# Patient Record
Sex: Female | Born: 1955 | Hispanic: Yes | Marital: Married | Smoking: Never smoker
Health system: Southern US, Community
[De-identification: ages and names within clinical notes are randomized; demographics above are authoritative.]

## PROBLEM LIST (undated history)

## (undated) DIAGNOSIS — E785 Hyperlipidemia, unspecified: Secondary | ICD-10-CM

## (undated) DIAGNOSIS — M8580 Other specified disorders of bone density and structure, unspecified site: Secondary | ICD-10-CM

## (undated) DIAGNOSIS — R7401 Elevation of levels of liver transaminase levels: Secondary | ICD-10-CM

## (undated) DIAGNOSIS — E559 Vitamin D deficiency, unspecified: Secondary | ICD-10-CM

## (undated) HISTORY — DX: Hyperlipidemia, unspecified: E78.5

## (undated) HISTORY — DX: Elevation of levels of liver transaminase levels: R74.01

## (undated) HISTORY — PX: TONSILLECTOMY AND ADENOIDECTOMY: SHX28

## (undated) HISTORY — DX: Other specified disorders of bone density and structure, unspecified site: M85.80

## (undated) HISTORY — DX: Vitamin D deficiency, unspecified: E55.9

## (undated) HISTORY — PX: GALLBLADDER SURGERY: SHX652

---

## 2000-03-14 ENCOUNTER — Encounter (INDEPENDENT_AMBULATORY_CARE_PROVIDER_SITE_OTHER): Payer: Self-pay | Admitting: *Deleted

## 2000-11-10 ENCOUNTER — Encounter: Admission: RE | Admit: 2000-11-10 | Discharge: 2000-11-10 | Payer: Self-pay | Admitting: Sports Medicine

## 2000-11-24 ENCOUNTER — Encounter: Admission: RE | Admit: 2000-11-24 | Discharge: 2000-11-24 | Payer: Self-pay | Admitting: Family Medicine

## 2001-04-04 ENCOUNTER — Emergency Department (HOSPITAL_COMMUNITY): Admission: EM | Admit: 2001-04-04 | Discharge: 2001-04-04 | Payer: Self-pay | Admitting: Emergency Medicine

## 2001-10-14 ENCOUNTER — Encounter: Payer: Self-pay | Admitting: Emergency Medicine

## 2001-10-14 ENCOUNTER — Emergency Department (HOSPITAL_COMMUNITY): Admission: EM | Admit: 2001-10-14 | Discharge: 2001-10-14 | Payer: Self-pay | Admitting: Emergency Medicine

## 2001-12-21 ENCOUNTER — Encounter: Admission: RE | Admit: 2001-12-21 | Discharge: 2002-01-14 | Payer: Self-pay | Admitting: Orthopedic Surgery

## 2002-04-20 ENCOUNTER — Encounter: Payer: Self-pay | Admitting: Internal Medicine

## 2002-04-20 ENCOUNTER — Ambulatory Visit (HOSPITAL_COMMUNITY): Admission: RE | Admit: 2002-04-20 | Discharge: 2002-04-20 | Payer: Self-pay | Admitting: Internal Medicine

## 2002-07-16 ENCOUNTER — Ambulatory Visit (HOSPITAL_COMMUNITY): Admission: RE | Admit: 2002-07-16 | Discharge: 2002-07-16 | Payer: Self-pay | Admitting: Family Medicine

## 2002-09-30 ENCOUNTER — Ambulatory Visit (HOSPITAL_COMMUNITY): Admission: RE | Admit: 2002-09-30 | Discharge: 2002-09-30 | Payer: Self-pay | Admitting: Family Medicine

## 2002-09-30 ENCOUNTER — Encounter: Payer: Self-pay | Admitting: Family Medicine

## 2002-11-02 ENCOUNTER — Other Ambulatory Visit: Admission: RE | Admit: 2002-11-02 | Discharge: 2002-11-02 | Payer: Self-pay | Admitting: Obstetrics & Gynecology

## 2002-11-02 ENCOUNTER — Encounter: Admission: RE | Admit: 2002-11-02 | Discharge: 2002-11-02 | Payer: Self-pay | Admitting: Obstetrics and Gynecology

## 2002-11-02 ENCOUNTER — Encounter (INDEPENDENT_AMBULATORY_CARE_PROVIDER_SITE_OTHER): Payer: Self-pay | Admitting: *Deleted

## 2002-11-09 ENCOUNTER — Encounter (INDEPENDENT_AMBULATORY_CARE_PROVIDER_SITE_OTHER): Payer: Self-pay | Admitting: *Deleted

## 2002-11-09 ENCOUNTER — Encounter: Admission: RE | Admit: 2002-11-09 | Discharge: 2002-11-09 | Payer: Self-pay | Admitting: Obstetrics and Gynecology

## 2005-01-22 ENCOUNTER — Emergency Department (HOSPITAL_COMMUNITY): Admission: EM | Admit: 2005-01-22 | Discharge: 2005-01-22 | Payer: Self-pay | Admitting: Emergency Medicine

## 2005-01-24 ENCOUNTER — Encounter: Admission: RE | Admit: 2005-01-24 | Discharge: 2005-01-24 | Payer: Self-pay | Admitting: Family Medicine

## 2006-02-07 ENCOUNTER — Encounter: Admission: RE | Admit: 2006-02-07 | Discharge: 2006-02-07 | Payer: Self-pay | Admitting: Family Medicine

## 2006-02-07 ENCOUNTER — Other Ambulatory Visit: Admission: RE | Admit: 2006-02-07 | Discharge: 2006-02-07 | Payer: Self-pay | Admitting: Family Medicine

## 2006-04-11 ENCOUNTER — Encounter (INDEPENDENT_AMBULATORY_CARE_PROVIDER_SITE_OTHER): Payer: Self-pay | Admitting: *Deleted

## 2007-06-03 ENCOUNTER — Encounter: Admission: RE | Admit: 2007-06-03 | Discharge: 2007-06-03 | Payer: Self-pay | Admitting: Family Medicine

## 2008-06-27 ENCOUNTER — Ambulatory Visit: Payer: Self-pay | Admitting: Gynecology

## 2008-08-03 ENCOUNTER — Ambulatory Visit: Payer: Self-pay | Admitting: Vascular Surgery

## 2008-10-21 ENCOUNTER — Ambulatory Visit: Payer: Self-pay | Admitting: Vascular Surgery

## 2008-11-23 ENCOUNTER — Ambulatory Visit: Payer: Self-pay | Admitting: Vascular Surgery

## 2008-11-30 ENCOUNTER — Ambulatory Visit: Payer: Self-pay | Admitting: Vascular Surgery

## 2009-01-13 ENCOUNTER — Ambulatory Visit: Payer: Self-pay | Admitting: Vascular Surgery

## 2010-03-04 ENCOUNTER — Encounter: Payer: Self-pay | Admitting: Family Medicine

## 2010-06-26 NOTE — Procedures (Signed)
LOWER EXTREMITY VENOUS REFLUX EXAM   INDICATION:  Left lower extremity varicose vein.   EXAM:  Using color-flow imaging and pulse Doppler spectral analysis, the  left common femoral, superficial femoral, popliteal, posterior tibial,  greater and lesser saphenous veins are evaluated.  There is no evidence  suggesting deep venous insufficiency in the left lower extremity.   The left saphenofemoral junction is not competent.  The left GSV is not  competent with the caliber as described below.   The left proximal short saphenous vein demonstrates competency.   GSV Diameter (used if found to be incompetent only)                                            Right    Left  Proximal Greater Saphenous Vein           cm       0.7 cm  Proximal-to-mid-thigh                     cm       cm  Mid thigh                                 cm       0.74 cm  Mid-distal thigh                          cm       cm  Distal thigh                              cm       0.62 cm  Knee                                      cm       0.25 cm   IMPRESSION:  1. Left greater saphenous vein reflux is identified as described      above.  2. The left greater saphenous vein is not tortuous.  3. The left deep venous system is competent.  4. The left lesser saphenous vein is competent.   ___________________________________________  [name], M.D.   CH/MEDQ  D:  08/03/2008  T:  08/03/2008  Job:  [id number]

## 2010-06-26 NOTE — Assessment & Plan Note (Signed)
OFFICE VISIT   MADALINA, ROSMAN  DOB:  [birth year]                                       11/23/2008  EAVWU#:[id number]   The patient presents today for laser ablation and stab phlebectomy.  This was done uneventfully under local anesthesia in our office.  She  had no complication of the procedure and will be seen again in 1 week  for ultrasound and physical exam followup on her left leg.   [name], M.D.  Electronically Signed   TFE/MEDQ  D:  11/23/2008  T:  11/24/2008  Job:  [id number]   cc:   Gaetano Hawthorne. Lily Peer, M.D.

## 2010-06-26 NOTE — Assessment & Plan Note (Signed)
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  She has done quite  well since the procedure.  She does report some persistent discomfort in  the medial thigh just above the knee.  She does not have any evidence of  erythema.  She is resuming her normal activities and is requesting the  ability to resume walking on her treadmill and running on her treadmill,  and I have stated that this is fine to proceed.   She did undergo a screening ultrasound by myself, and this shows  excellent postoperative appearance with closure of her great saphenous  vein throughout the thigh.  She does not have any evidence of any  thrombosed reticular or tributary varicosities at the level of her knee.  The stab phlebectomy sites all look quite good.   I have reassured patient and her husband, who is present.  I do not see  any evidence of any difficulty following procedure, and this should  continue to resolve.  She will notify us should she develop any  difficulty in the future.   [name], M.D.  Electronically Signed   TFE/MEDQ  D:  01/13/2009  T:  01/13/2009  Job:  [id number]   cc:   Gaetano Hawthorne. Lily Peer, M.D.

## 2010-06-26 NOTE — Assessment & Plan Note (Signed)
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  She is very pleasant  active 55 year old female who I had seen initially 3 months ago.  She  has been diligent in her thigh-high graduated compression garments and  reports this has given her no improvement.  She actually has more  discomfort with the compression.  On physical exam, she has marked  varicose veins beginning in her medial distal thigh and extending around  to her popliteal space and then lateral on her left calf.  Her  ultrasound reveals that her small saphenous vein is actually competent  and these are all arising from the great saphenous vein which has marked  reflux.  The patient has had to stop walking for exercise due to pain.  She used to walk 2-3 miles daily, she also has had to decrease her  household work and cleaning activities due to leg pain.  I discussed the  procedure with the patient and her husband present.  I have recommended  that we proceed with laser ablation of her left great saphenous vein and  stab phlebectomy of multiple tributary varicosities in her thigh,  popliteal space and calf.  She understands and we will proceed with this  one we have assured insurance coverage.   [name], M.D.  Electronically Signed   TFE/MEDQ  D:  10/21/2008  T:  10/21/2008  Job:  [id number]

## 2010-06-26 NOTE — Assessment & Plan Note (Signed)
OFFICE VISIT   [name] [name], [name] [name]  DOB:  1955-06-05                                       11/30/2008  WPYKD#:[id number]   The patient presents today for 1-week follow-up of laser ablation of her  left great saphenous vein, stab phlebectomy multiple tributary  varicosities in her medial thigh and popliteal fossa and posterior calf.  She has done well.  She has the usual amount of soreness over the  ablation site and no discomfort over calf.  She has mild bruising.  She  has been wearing compression garments on a daily basis.  She underwent  duplex follow-up today and this shows closure of her left great  saphenous vein and no evidence of DVT.  I am quite pleased with her  initial result as is the patient.  I will see her again in 6 weeks for  final follow-up.   [name], M.D.  Electronically Signed   TFE/MEDQ  D:  11/30/2008  T:  12/01/2008  Job:  [id number]   cc:   Gaetano Hawthorne. Lily Peer, M.D.

## 2010-06-26 NOTE — Procedures (Signed)
DUPLEX DEEP VENOUS EXAM - LOWER EXTREMITY   INDICATION:  Follow up laser ablation.   HISTORY:  Edema:  Yes  Trauma/Surgery:  Laser ablation of left greater saphenous vein.  Pain:  Yes  PE:  No  Previous DVT:  No  Anticoagulants:  No  Other:   DUPLEX EXAM:                CFV   SFV   PopV  PTV    GSV                R  L  R  L  R  L  R   L  R  L  Thrombosis       0     0     0      0     +  Spontaneous      +     +     +      +     0  Phasic           +     +     +      +     0  Augmentation     +     +     +      +     0  Compressible     +     +     +      +     0  Competent        +     +     +      +     0   Legend:  + - yes  o - no  p - partial  D - decreased   IMPRESSION:  There is no deep vein thrombus present in the left leg.  There does appear to be echogenic material in the left greater saphenous  vein from proximal level to above-knee level.    _____________________________  [name], M.D.   CB/MEDQ  D:  11/30/2008  T:  12/01/2008  Job:  [id number]

## 2010-06-26 NOTE — Consult Note (Signed)
NEW PATIENT CONSULTATION   [name], [name]  DOB:  08-18-1955                                       08/03/2008  [id number]#:[id number]   The patient presents today for evaluation of her left leg venous  varicosities and venous hypertension.  She is a very pleasant 55-year-  old female with progressive changes over the past year of varicosities  that extend down into her posterior calf.  She denies any prior history  of deep venous thrombosis or superficial thrombophlebitis or bleeding.  She reports that this has caused swelling and aching with prolonged  standing and she also has pain specifically over the varicosities  themselves.  She has no history of major medical difficulties  specifically no cardiac or pulmonary dysfunction.   FAMILY HISTORY:  Significant for venous varicosities in her mother.   SOCIAL HISTORY:  She is married with three children.  She does not work  outside the home.  She does not smoke cigarettes and does have  occasional social alcohol consumption.   REVIEW OF SYSTEMS:  Her weight is reported at 135 pounds.  She is 5 feet  4 inches tall.  Review of systems is negative for cardiac, pulmonary, GI  or GU dysfunction.  She does have pain in her legs with prolonged  standing and no orthopedic issues.   MEDICATIONS:  None.   ALLERGIES:  Codeine.   PHYSICAL EXAMINATION:  Vital signs:  Blood pressure is 117/58, heart  rate is 59, respirations are 18.  Her radial and dorsalis pedis pulses  are 2+ bilaterally.  She does not have any evidence of venous pathology  in the right leg.  On the left leg she has marked varicosities extending  from her medial distal thigh down into her posterior calf.  She  underwent noninvasive vascular laboratory studies in our office and this  reveals gross reflux beyond her left great saphenous vein extending into  the varicosities.   I had a long discussion with the patient.  I explained that this is not  a  specifically dangerous situation and does not preclude her to any more  serious problems with deep venous thrombosis.  I did explain the  importance of elevation which she is currently doing, ibuprofen for  discomfort and also compression garments for relief of her venous  hypertension.  She is fitted for graduated compression garments today in  our office of 20-30 mmHg thigh high.  I will see her again in 3 months  to determine if she is having acceptable treatment from this  conservative treatment.  I did explain the option of ablation of her  great saphenous vein and stab phlebectomy if she fails conservative  treatment.  We will see her again in 3 months.   [name], M.D.  Electronically Signed   TFE/MEDQ  D:  08/03/2008  T:  08/04/2008  Job:  [id number]   cc:   Gaetano Hawthorne. Lily Peer, M.D.

## 2010-06-29 NOTE — Letter (Signed)
September 02, 2008   Juan H. Lily Peer, M.D.  [address]  [place]  [place] [address]   Re:  [name] [name], MIAH                  DOB:  19-Aug-1955   Dear [name]:   You should have received my note from my prior office visit with [name] [name].  I subsequently received a note was a question regarding timing  of vein treatment versus any potential need for treatment for herniated  disk.  The vein treatment would be done as an outpatient and actually  cannot physically be done at the hospital due to equipment issues.  I  would see these as separate issues and feel it would be completely safe  and appropriate to proceed with any sort of disk treatment as required  and would not see her vascular issues as a contraindication to this.  She is to see me again for continued followup after we have initiated  conservative treatment for her painful varices.  Please let me know if I  could provide further information.   Sincerely,   [name], M.D.  Electronically Signed   TFE/MEDQ  D:  09/02/2008  T:  09/03/2008  Job:  [id number]

## 2011-03-29 ENCOUNTER — Ambulatory Visit: Payer: Self-pay | Admitting: Internal Medicine

## 2013-07-26 ENCOUNTER — Other Ambulatory Visit: Payer: Self-pay | Admitting: Family Medicine

## 2013-07-26 DIAGNOSIS — R109 Unspecified abdominal pain: Secondary | ICD-10-CM

## 2013-08-02 ENCOUNTER — Other Ambulatory Visit: Payer: Self-pay

## 2013-08-10 ENCOUNTER — Other Ambulatory Visit: Payer: Self-pay | Admitting: Family Medicine

## 2013-08-10 ENCOUNTER — Other Ambulatory Visit: Payer: Self-pay

## 2013-08-10 DIAGNOSIS — R1011 Right upper quadrant pain: Secondary | ICD-10-CM

## 2013-08-11 ENCOUNTER — Ambulatory Visit
Admission: RE | Admit: 2013-08-11 | Discharge: 2013-08-11 | Disposition: A | Discharge status: Home / Self Care | Payer: 59 | Source: Ambulatory Visit | Attending: Family Medicine | Admitting: Family Medicine

## 2013-08-11 ENCOUNTER — Other Ambulatory Visit: Payer: Self-pay

## 2013-08-11 DIAGNOSIS — R1011 Right upper quadrant pain: Secondary | ICD-10-CM

## 2015-11-23 ENCOUNTER — Encounter (HOSPITAL_COMMUNITY): Payer: Self-pay | Admitting: Emergency Medicine

## 2015-11-23 ENCOUNTER — Emergency Department (HOSPITAL_COMMUNITY)
Admission: EM | Admit: 2015-11-23 | Discharge: 2015-11-23 | Disposition: A | Discharge status: Home / Self Care | Payer: Managed Care, Other (non HMO) | Attending: Emergency Medicine | Admitting: Emergency Medicine

## 2015-11-23 DIAGNOSIS — Y999 Unspecified external cause status: Secondary | ICD-10-CM | POA: Insufficient documentation

## 2015-11-23 DIAGNOSIS — S40861A Insect bite (nonvenomous) of right upper arm, initial encounter: Secondary | ICD-10-CM | POA: Insufficient documentation

## 2015-11-23 DIAGNOSIS — Y929 Unspecified place or not applicable: Secondary | ICD-10-CM | POA: Insufficient documentation

## 2015-11-23 DIAGNOSIS — W57XXXA Bitten or stung by nonvenomous insect and other nonvenomous arthropods, initial encounter: Secondary | ICD-10-CM | POA: Diagnosis not present

## 2015-11-23 DIAGNOSIS — T7840XA Allergy, unspecified, initial encounter: Secondary | ICD-10-CM

## 2015-11-23 DIAGNOSIS — Y939 Activity, unspecified: Secondary | ICD-10-CM | POA: Diagnosis not present

## 2015-11-23 MED ORDER — DIPHENHYDRAMINE HCL 25 MG PO CAPS
50.0000 mg | ORAL_CAPSULE | Freq: Once | ORAL | Status: AC
  Administered 2015-11-23: 50 mg via ORAL
  Filled 2015-11-23: qty 2

## 2015-11-23 MED ORDER — PREDNISONE 20 MG PO TABS
60.0000 mg | ORAL_TABLET | Freq: Once | ORAL | Status: AC
  Administered 2015-11-23: 60 mg via ORAL
  Filled 2015-11-23: qty 3

## 2015-11-23 MED ORDER — HYDROCORTISONE 1 % EX CREA: TOPICAL_CREAM | CUTANEOUS | 0 refills | Status: DC

## 2015-11-23 MED ORDER — PREDNISONE 20 MG PO TABS: 40.0000 mg | ORAL_TABLET | Freq: Every day | ORAL | 0 refills | Status: DC

## 2015-11-23 NOTE — ED Provider Notes (Signed)
[facility]-EMERGENCY DEPT Provider Note   CSN: [id number] Arrival date & time: 11/23/15  0127     History   Chief Complaint Chief Complaint  Patient presents with  . Insect Bite    Yellow Jacket sting    HPI [name] [name] is a 60 y.o. female.  Patient presents to the emergency department with chief complaint of yellow jacket sting. She states that she has been stung a couple of times over the past 2 days. She reports right upper arm itching with some local skin swelling. She denies any pain. She denies any fevers chills. She denies having taken anything for symptoms. There are no aggravating or alleviating factors. She denies any shortness breath, wheezing, throat swelling, or difficulties, nausea, vomiting, or diarrhea.   The history is provided by the patient. No language interpreter was used.    History reviewed. No pertinent past medical history.  There are no active problems to display for this patient.   History reviewed. No pertinent surgical history.  OB History    No data available       Home Medications    Prior to Admission medications   Medication Sig Start Date End Date Taking? Authorizing Provider  predniSONE (DELTASONE) 20 MG tablet Take 2 tablets (40 mg total) by mouth daily. Take 40 mg by mouth daily for 3 days, then 20mg  by mouth daily for 3 days, then 10mg  daily for 3 days 11/23/15   , PA-C    Family History History reviewed. No pertinent family history.  Social History Social History  Substance Use Topics  . Smoking status: Never Smoker  . Smokeless tobacco: Never Used  . Alcohol use No     Allergies   Codeine   Review of Systems Review of Systems  All other systems reviewed and are negative.    Physical Exam Updated Vital Signs BP 139/62 (BP Location: Left Arm)   Pulse 61   Temp 97.7 F (36.5 C) (Oral)   Resp 22   Ht 5\' 2"  (1.575 m)   Wt 69.1 kg   SpO2 98%   BMI 27.85 kg/m   Physical Exam Physical Exam    Constitutional: Pt appears well-developed and well-nourished. No distress.  Awake, alert, nontoxic appearance  HENT:  Head: Normocephalic and atraumatic.  Mouth/Throat: Oropharynx is clear and moist. No oropharyngeal exudate.  Eyes: Conjunctivae are normal. No scleral icterus.  Neck: Normal range of motion. Neck supple.  Cardiovascular: Normal rate, regular rhythm and intact distal pulses.   Pulmonary/Chest: Effort normal and breath sounds normal. No respiratory distress. Pt has no wheezes.  Equal chest expansion  Abdominal: Soft. Bowel sounds are normal. Pt exhibits no mass. There is no tenderness. There is no rebound and no guarding.  Musculoskeletal: Normal range of motion. Pt exhibits no edema.  Neurological: Pt is alert.  Speech is clear and goal oriented Moves extremities without ataxia  Skin: Skin is warm and dry. Pt is not diaphoretic.  Right upper arm is mildly swollen around insect bite, no evidence of cellulitis or abscess  Psychiatric: Pt has a normal mood and affect.  Nursing note and vitals reviewed.    ED Treatments / Results  Labs (all labs ordered are listed, but only abnormal results are displayed) Labs Reviewed - No data to display  EKG  EKG Interpretation None       Radiology No results found.  Procedures Procedures (including critical care time)  Medications Ordered in ED Medications  predniSONE (DELTASONE) tablet 60 mg (  60 mg Oral Given 11/23/15 0154)  diphenhydrAMINE (BENADRYL) capsule 50 mg (50 mg Oral Given 11/23/15 0153)     Initial Impression / Assessment and Plan / ED Course  I have reviewed the triage vital signs and the nursing notes.  Pertinent labs & imaging results that were available during my care of the patient were reviewed by me and considered in my medical decision making (see chart for details).  Clinical Course    Patient with bee sting versus wasp sting. Patient does have local skin changes and itching.  I see no  evidence of anaphylactic reaction. There is no evidence of cellulitis or abscess.  Final Clinical Impressions(s) / ED Diagnoses   Final diagnoses:  Allergic reaction, initial encounter  Insect bite, initial encounter    New Prescriptions New Prescriptions   HYDROCORTISONE CREAM 1 %    Apply to affected area 2 times daily   PREDNISONE (DELTASONE) 20 MG TABLET    Take 2 tablets (40 mg total) by mouth daily. Take 40 mg by mouth daily for 3 days, then 20mg  by mouth daily for 3 days, then 10mg  daily for 3 days     , PA-C 11/23/15 0339    Roxy Horseman Ward, DO 11/23/15 [phone]

## 2015-11-23 NOTE — ED Triage Notes (Signed)
Patient was stung by a yellow jack on Sunday on right upper arm.  She did not have any type of reaction until tonight, now with red area on right upper arm.  Area is hard, warm and swollen.  No shortness of breath, no tongue swelling.

## 2016-01-13 ENCOUNTER — Ambulatory Visit (INDEPENDENT_AMBULATORY_CARE_PROVIDER_SITE_OTHER): Payer: 59 | Admitting: Physician Assistant

## 2016-01-13 VITALS — BP 118/70 | HR 65 | Temp 98.0°F | Resp 16 | Ht 62.0 in | Wt 154.0 lb

## 2016-01-13 DIAGNOSIS — R300 Dysuria: Secondary | ICD-10-CM

## 2016-01-13 LAB — POCT URINALYSIS DIP (MANUAL ENTRY)
BILIRUBIN UA: NEGATIVE
BILIRUBIN UA: NEGATIVE
GLUCOSE UA: NEGATIVE
Nitrite, UA: NEGATIVE
Protein Ur, POC: NEGATIVE
SPEC GRAV UA: 1.025
Urobilinogen, UA: 0.2
pH, UA: 6

## 2016-01-13 LAB — POC MICROSCOPIC URINALYSIS (UMFC)

## 2016-01-13 MED ORDER — SULFAMETHOXAZOLE-TRIMETHOPRIM 800-160 MG PO TABS: 1.0000 | ORAL_TABLET | Freq: Two times a day (BID) | ORAL | 0 refills | Status: DC

## 2016-01-13 NOTE — Progress Notes (Signed)
[facility] [address], [place] [address] [phone]  Date:  01/13/2016   Name:  [name] [name]   DOB:  [birth year]   MRN:  [id number]  PCP:  No primary care provider on file.    History of Present Illness:  [name] [name] is a 60 y.o. female patient who presents to [facility] for cc of dysuria. Dysuria began today and frequency throughout the day.  No fever, abdominal pain, or nausea.   No abnormal vaginal discharge or odor, or pruritus.  No back pain.   There are no active problems to display for this patient.   No past medical history on file.  No past surgical history on file.  Social History  Substance Use Topics  . Smoking status: Never Smoker  . Smokeless tobacco: Never Used  . Alcohol use No    No family history on file.  Allergies  Allergen Reactions  . Codeine     Medication list has been reviewed and updated.  No current outpatient prescriptions on file prior to visit.   No current facility-administered medications on file prior to visit.     ROS ROS otherwise unremarkable unless listed above.   Physical Examination: BP 118/70   Pulse 65   Temp 98 F (36.7 C) (Oral)   Resp 16   Ht 5\' 2"  (1.575 m)   Wt 154 lb (69.9 kg)   SpO2 98%   BMI 28.17 kg/m  Ideal Body Weight: Weight in (lb) to have BMI = 25: 136.4  Physical Exam  Constitutional: She is oriented to person, place, and time. She appears well-developed and well-nourished. No distress.  HENT:  Head: Normocephalic and atraumatic.  Right Ear: External ear normal.  Left Ear: External ear normal.  Eyes: Conjunctivae and EOM are normal. Pupils are equal, round, and reactive to light.  Cardiovascular: Normal rate.   Pulmonary/Chest: Effort normal. No respiratory distress.  Abdominal: Soft. Normal appearance and bowel sounds are normal. There is no tenderness.  Neurological: She is alert and oriented to person, place, and time.  Skin: She is not diaphoretic.  Psychiatric:  She has a normal mood and affect. Her behavior is normal.     Assessment and Plan: [name] [name] is a 60 y.o. female who is here today for cc of dysuria. Starting bactrim. Urine culture obtained.  Will follow up with results.  Dysuria - Plan: POCT urinalysis dipstick, POCT Microscopic Urinalysis ([facility]), sulfamethoxazole-trimethoprim (BACTRIM DS,SEPTRA DS) 800-160 MG tablet, Urine culture  77, PA-C [facility] 01/13/2016 3:04 PM

## 2016-01-13 NOTE — Patient Instructions (Addendum)
Please make sure that you are hydrating well with water.   You can use tylenol or ibuprofen for pain. Urinary Tract Infection, Adult Introduction A urinary tract infection (UTI) is an infection of any part of the urinary tract. The urinary tract includes the:  Kidneys.  Ureters.  Bladder.  Urethra. These organs make, store, and get rid of pee (urine) in the body. Follow these instructions at home:  Take over-the-counter and prescription medicines only as told by your doctor.  If you were prescribed an antibiotic medicine, take it as told by your doctor. Do not stop taking the antibiotic even if you start to feel better.  Avoid the following drinks:  Alcohol.  Caffeine.  Tea.  Carbonated drinks.  Drink enough fluid to keep your pee clear or pale yellow.  Keep all follow-up visits as told by your doctor. This is important.  Make sure to:  Empty your bladder often and completely. Do not to hold pee for long periods of time.  Empty your bladder before and after sex.  Wipe from front to back after a bowel movement if you are female. Use each tissue one time when you wipe. Contact a doctor if:  You have back pain.  You have a fever.  You feel sick to your stomach (nauseous).  You throw up (vomit).  Your symptoms do not get better after 3 days.  Your symptoms go away and then come back. Get help right away if:  You have very bad back pain.  You have very bad lower belly (abdominal) pain.  You are throwing up and cannot keep down any medicines or water. This information is not intended to replace advice given to you by your health care provider. Make sure you discuss any questions you have with your health care provider. Document Released: 07/17/2007 Document Revised: 07/06/2015 Document Reviewed: 12/19/2014  2017 Elsevier     IF you received an x-ray today, you will receive an invoice from [facility]. Please contact [facility] at  [phone] with questions or concerns regarding your invoice.   IF you received labwork today, you will receive an invoice from [employer]. Please contact [facility] at [phone] with questions or concerns regarding your invoice.   Our billing staff will not be able to assist you with questions regarding bills from these companies.  You will be contacted with the lab results as soon as they are available. The fastest way to get your results is to activate your My Chart account. Instructions are located on the last page of this paperwork. If you have not heard from [place] regarding the results in 2 weeks, please contact this office.

## 2016-01-13 NOTE — Progress Notes (Signed)
t

## 2016-01-16 LAB — URINE CULTURE

## 2016-02-10 ENCOUNTER — Encounter: Payer: Self-pay | Admitting: Physician Assistant

## 2018-02-11 HISTORY — PX: COLONOSCOPY: SHX174

## 2018-06-10 ENCOUNTER — Ambulatory Visit: Payer: 59 | Admitting: Obstetrics & Gynecology

## 2018-07-01 ENCOUNTER — Ambulatory Visit: Payer: 59 | Admitting: Obstetrics & Gynecology

## 2018-07-14 ENCOUNTER — Ambulatory Visit: Payer: 59 | Admitting: Obstetrics & Gynecology

## 2018-08-03 ENCOUNTER — Ambulatory Visit: Payer: 59 | Admitting: Obstetrics & Gynecology

## 2018-09-22 ENCOUNTER — Ambulatory Visit: Payer: 59 | Admitting: Obstetrics & Gynecology

## 2018-10-30 ENCOUNTER — Other Ambulatory Visit: Payer: Self-pay

## 2018-11-02 ENCOUNTER — Other Ambulatory Visit: Payer: Self-pay

## 2018-11-02 ENCOUNTER — Ambulatory Visit (INDEPENDENT_AMBULATORY_CARE_PROVIDER_SITE_OTHER): Payer: 59 | Admitting: Obstetrics & Gynecology

## 2018-11-02 ENCOUNTER — Encounter: Payer: Self-pay | Admitting: Obstetrics & Gynecology

## 2018-11-02 VITALS — BP 150/82 | Ht 59.0 in | Wt 152.0 lb

## 2018-11-02 DIAGNOSIS — Z6830 Body mass index (BMI) 30.0-30.9, adult: Secondary | ICD-10-CM

## 2018-11-02 DIAGNOSIS — Z01419 Encounter for gynecological examination (general) (routine) without abnormal findings: Secondary | ICD-10-CM | POA: Diagnosis not present

## 2018-11-02 DIAGNOSIS — Z13820 Encounter for screening for osteoporosis: Secondary | ICD-10-CM | POA: Diagnosis not present

## 2018-11-02 DIAGNOSIS — Z1151 Encounter for screening for human papillomavirus (HPV): Secondary | ICD-10-CM

## 2018-11-02 DIAGNOSIS — M069 Rheumatoid arthritis, unspecified: Secondary | ICD-10-CM

## 2018-11-02 DIAGNOSIS — E6609 Other obesity due to excess calories: Secondary | ICD-10-CM

## 2018-11-02 DIAGNOSIS — Z780 Asymptomatic menopausal state: Secondary | ICD-10-CM

## 2018-11-02 NOTE — Patient Instructions (Signed)
1. Encounter for routine gynecological examination with Papanicolaou smear of cervix Normal gynecologic exam in menopause.  Pap test with high-risk HPV done.  Breast exam normal.  Will schedule a screening mammogram now.  Colonoscopy 2020.  Follow-up fasting health labs here. - CBC; Future - Comp Met (CMET); Future - TSH; Future - Lipid panel; Future - VITAMIN D 25 Hydroxy (Vit-D Deficiency, Fractures); Future  2. Postmenopause Well on no hormone replacement therapy.  No postmenopausal bleeding.  3. Screening for osteoporosis Follow-up here for a bone density now.  Vitamin D supplements, calcium intake of 1200 mg daily and regular weightbearing physical activities. - DG Bone Density; Future  4. Rheumatoid arthritis involving both hands, unspecified rheumatoid factor presence (HCC) Pain and displacement of finger joints with nodules typical of rheumatoid arthritis.  Per patient, mother had rheumatoid arthritis.  Referred to Rheumatologist.  5. Class 1 obesity due to excess calories without serious comorbidity with body mass index (BMI) of 30.0 to 30.9 in adult Recommend a lower calorie/carb diet such as [employer].  Aerobic physical activities 5 times a week and weightlifting every 2 days.  [name] [name], it was a pleasure meeting you today!  I will inform you of your results as soon as they are available.

## 2018-11-02 NOTE — Progress Notes (Signed)
[name] [name] 01/03/1956 [id number]   History:    62 y.o. [id number] Married  RP:  New patient presenting for annual gyn exam   HPI: Postmenopausal, well on no hormone replacement therapy.  No postmenopausal bleeding.  No pelvic pain.  Low libido, but no pain with intercourse.  Recommended coconut oil as needed for intercourse.  Urine and bowel movements normal.  Breasts normal.  Body mass index 30.70.  Low physical activity level.  We will follow-up here for fasting health labs.  Colonoscopy 2020.  Past medical history,surgical history, family history and social history were all reviewed and documented in the EPIC chart.  Gynecologic History No LMP recorded. Patient is postmenopausal. Contraception: post menopausal status Last Pap: 2019. Results were: normal per patient Last mammogram: >4 yrs ago. Results were: normal per patient Bone Density: No recent BD Colonoscopy: 2020  Obstetric History OB History  Gravida Para Term Preterm AB Living  _0 SAB TAB Ectopic Multiple Live Births  3            # Outcome Date GA Lbr Len/2nd Weight Sex Delivery Anes PTL Lv  6 SAB           5 SAB           4 SAB           3 Para           2 Para           1 Para              ROS: A ROS was performed and pertinent positives and negatives are included in the history.  GENERAL: No fevers or chills. HEENT: No change in vision, no earache, sore throat or sinus congestion. NECK: No pain or stiffness. CARDIOVASCULAR: No chest pain or pressure. No palpitations. PULMONARY: No shortness of breath, cough or wheeze. GASTROINTESTINAL: No abdominal pain, nausea, vomiting or diarrhea, melena or bright red blood per rectum. GENITOURINARY: No urinary frequency, urgency, hesitancy or dysuria. MUSCULOSKELETAL: No joint or muscle pain, no back pain, no recent trauma. DERMATOLOGIC: No rash, no itching, no lesions. ENDOCRINE: No polyuria, polydipsia, no heat or cold intolerance. No recent change in weight.  HEMATOLOGICAL: No anemia or easy bruising or bleeding. NEUROLOGIC: No headache, seizures, numbness, tingling or weakness. PSYCHIATRIC: No depression, no loss of interest in normal activity or change in sleep pattern.     Exam:   BP (!) 150/82   Ht 4' 11" (1.499 m)   Wt 152 lb (68.9 kg)   BMI 30.70 kg/m   Body mass index is 30.7 kg/m.  General appearance : Well developed well nourished female. No acute distress HEENT: Eyes: no retinal hemorrhage or exudates,  Neck supple, trachea midline, no carotid bruits, no thyroidmegaly Lungs: Clear to auscultation, no rhonchi or wheezes, or rib retractions  Heart: Regular rate and rhythm, no murmurs or gallops Breast:Examined in sitting and supine position were symmetrical in appearance, no palpable masses or tenderness,  no skin retraction, no nipple inversion, no nipple discharge, no skin discoloration, no axillary or supraclavicular lymphadenopathy Abdomen: no palpable masses or tenderness, no rebound or guarding Extremities: no edema or skin discoloration or tenderness  Pelvic: Vulva: Normal             Vagina: No gross lesions or discharge  Cervix: No gross lesions or discharge.  Pap/HPV HR done.  Uterus  AV, normal size, shape and consistency, non-tender  and mobile  Adnexa  Without masses or tenderness  Anus: Normal   Assessment/Plan:  63 y.o. female for annual exam   1. Encounter for routine gynecological examination with Papanicolaou smear of cervix Normal gynecologic exam in menopause.  Pap test with high-risk HPV done.  Breast exam normal.  Will schedule a screening mammogram now.  Colonoscopy 2020.  Follow-up fasting health labs here. - CBC; Future - Comp Met (CMET); Future - TSH; Future - Lipid panel; Future - VITAMIN D 25 Hydroxy (Vit-D Deficiency, Fractures); Future  2. Postmenopause Well on no hormone replacement therapy.  No postmenopausal bleeding.  3. Screening for osteoporosis Follow-up here for a bone density now.   Vitamin D supplements, calcium intake of 1200 mg daily and regular weightbearing physical activities. - DG Bone Density; Future  4. Rheumatoid arthritis involving both hands, unspecified rheumatoid factor presence (HCC) Pain and displacement of finger joints with nodules typical of rheumatoid arthritis.  Per patient, mother had rheumatoid arthritis.  Referred to Rheumatologist.  5. Class 1 obesity due to excess calories without serious comorbidity with body mass index (BMI) of 30.0 to 30.9 in adult Recommend a lower calorie/carb diet such as [employer].  Aerobic physical activities 5 times a week and weightlifting every 2 days.  Counseling on above issues and coordination of care more than 50% for 10 minutes. Princess Bruins MD, 4:23 PM 11/02/2018

## 2018-11-03 ENCOUNTER — Other Ambulatory Visit: Payer: 59

## 2018-11-03 DIAGNOSIS — Z01419 Encounter for gynecological examination (general) (routine) without abnormal findings: Secondary | ICD-10-CM

## 2018-11-03 LAB — PAP, TP IMAGING W/ HPV RNA, RFLX HPV TYPE 16,18/45: HPV DNA High Risk: NOT DETECTED

## 2018-11-03 LAB — CBC
HCT: 38.1 % (ref 35.0–45.0)
Hemoglobin: 13 g/dL (ref 11.7–15.5)
MCH: 31.5 pg (ref 27.0–33.0)
MCHC: 34.1 g/dL (ref 32.0–36.0)
MCV: 92.3 fL (ref 80.0–100.0)
MPV: 9.5 fL (ref 7.5–12.5)
Platelets: 348 10*3/uL (ref 140–400)
RBC: 4.13 10*6/uL (ref 3.80–5.10)
RDW: 12.8 % (ref 11.0–15.0)
WBC: 6.2 10*3/uL (ref 3.8–10.8)

## 2018-11-03 LAB — LIPID PANEL
Cholesterol: 254 mg/dL — ABNORMAL HIGH (ref <200)
HDL: 46 mg/dL — ABNORMAL LOW (ref >=50)
LDL Cholesterol (Calc): 176 mg/dL (calc) — ABNORMAL HIGH
Non-HDL Cholesterol (Calc): 208 mg/dL (calc) — ABNORMAL HIGH (ref <130)
Total CHOL/HDL Ratio: 5.5 (calc) — ABNORMAL HIGH (ref <5.0)
Triglycerides: 175 mg/dL — ABNORMAL HIGH (ref <150)

## 2018-11-03 LAB — COMPREHENSIVE METABOLIC PANEL
AG Ratio: 1.4 (calc) (ref 1.0–2.5)
ALT: 46 U/L — ABNORMAL HIGH (ref 6–29)
AST: 31 U/L (ref 10–35)
Albumin: 4 g/dL (ref 3.6–5.1)
Alkaline phosphatase (APISO): 53 U/L (ref 37–153)
BUN: 16 mg/dL (ref 7–25)
CO2: 27 mmol/L (ref 20–32)
Calcium: 9.2 mg/dL (ref 8.6–10.4)
Chloride: 105 mmol/L (ref 98–110)
Creat: 0.63 mg/dL (ref 0.50–0.99)
Globulin: 2.8 g/dL (calc) (ref 1.9–3.7)
Glucose, Bld: 107 mg/dL — ABNORMAL HIGH (ref 65–99)
Potassium: 3.5 mmol/L (ref 3.5–5.3)
Sodium: 140 mmol/L (ref 135–146)
Total Bilirubin: 0.8 mg/dL (ref 0.2–1.2)
Total Protein: 6.8 g/dL (ref 6.1–8.1)

## 2018-11-03 LAB — TSH: TSH: 3.33 mIU/L (ref 0.40–4.50)

## 2018-11-03 LAB — VITAMIN D 25 HYDROXY (VIT D DEFICIENCY, FRACTURES): Vit D, 25-Hydroxy: 16 ng/mL — ABNORMAL LOW (ref 30–100)

## 2018-11-04 ENCOUNTER — Other Ambulatory Visit: Payer: Self-pay | Admitting: Obstetrics & Gynecology

## 2018-11-04 DIAGNOSIS — Z1231 Encounter for screening mammogram for malignant neoplasm of breast: Secondary | ICD-10-CM

## 2018-11-06 ENCOUNTER — Telehealth: Payer: Self-pay | Admitting: *Deleted

## 2018-11-06 NOTE — Telephone Encounter (Signed)
-----   Message from Princess Bruins, MD sent at 11/02/2018  4:49 PM EDT ----- Regarding: Refer to Rheumatologist Probable Rheumatoid Arthritis with small joint displacement/nodules and pain in hands.

## 2018-11-06 NOTE — Telephone Encounter (Signed)
Referral faxed to [facility] they will call patient to schedule. I will route to Claudia to relay to patient in [place].

## 2018-11-26 ENCOUNTER — Ambulatory Visit (INDEPENDENT_AMBULATORY_CARE_PROVIDER_SITE_OTHER): Payer: 59 | Admitting: Internal Medicine

## 2018-11-26 ENCOUNTER — Encounter: Payer: Self-pay | Admitting: Internal Medicine

## 2018-11-26 ENCOUNTER — Other Ambulatory Visit: Payer: Self-pay

## 2018-11-26 VITALS — BP 110/70 | HR 65 | Temp 98.3°F | Ht 60.0 in | Wt 152.6 lb

## 2018-11-26 DIAGNOSIS — Z13820 Encounter for screening for osteoporosis: Secondary | ICD-10-CM

## 2018-11-26 DIAGNOSIS — R29898 Other symptoms and signs involving the musculoskeletal system: Secondary | ICD-10-CM

## 2018-11-26 DIAGNOSIS — E559 Vitamin D deficiency, unspecified: Secondary | ICD-10-CM

## 2018-11-26 DIAGNOSIS — Z780 Asymptomatic menopausal state: Secondary | ICD-10-CM | POA: Diagnosis not present

## 2018-11-26 DIAGNOSIS — R7401 Elevation of levels of liver transaminase levels: Secondary | ICD-10-CM | POA: Diagnosis not present

## 2018-11-26 DIAGNOSIS — E785 Hyperlipidemia, unspecified: Secondary | ICD-10-CM | POA: Diagnosis not present

## 2018-11-26 MED ORDER — VITAMIN D (ERGOCALCIFEROL) 1.25 MG (50000 UNIT) PO CAPS: 50000.0000 [IU] | ORAL_CAPSULE | ORAL | 0 refills | Status: AC

## 2018-11-26 MED ORDER — ATORVASTATIN CALCIUM 20 MG PO TABS: 20.0000 mg | ORAL_TABLET | Freq: Every day | ORAL | 1 refills | Status: DC

## 2018-11-26 NOTE — Patient Instructions (Signed)
-  Nice seeing you today!!  -START Vit D 1 tab weekly for 12 weeks then return to have your levels rechecked.  -START lipitor 20 mg daily to lower your cholesterol.  -Schedule follow up in 4 months.

## 2018-11-26 NOTE — Progress Notes (Signed)
New Patient Office Visit     CC/Reason for Visit: Establish care, discuss recent lab work Previous PCP: In [employer] Last Visit: Last year  HPI: [name] [name] is a 63 y.o. female who is coming in today for the above mentioned reasons.  She has no past medical history that she is aware of.  She is originally from [place] but has been living in [place] for 18 years, she prefers to speak [place] but is fluent in [place].  She works with a sewing business.  She does not smoke, she drinks alcohol only occasionally her family history is significant for a mother who died after a CVA and a father with hyperlipidemia.  She recently saw her GYN for her annual exam.  Lab work was abnormal and she was referred to me for further evaluation.  She was found to have vitamin D deficiency, significant hyperlipidemia and ALT elevation.  She has tells me about some left leg issues that she has had for years.  She does not have pain in of itself but she does feel sometimes like her left leg "pulls" when she walks and she sometimes drags it.   Past Medical/Surgical History: Past Medical History:  Diagnosis Date  . Hyperlipidemia   . Transaminitis   . Vitamin D deficiency     Past Surgical History:  Procedure Laterality Date  . GALLBLADDER SURGERY    . TONSILLECTOMY AND ADENOIDECTOMY      Social History:  reports that she has never smoked. She has never used smokeless tobacco. She reports current alcohol use. She reports that she does not use drugs.  Allergies: Allergies  Allergen Reactions  . Codeine     Family History:  Family History  Problem Relation Age of Onset  . Hypertension Father   . Hyperlipidemia Father   . CVA Mother      Current Outpatient Medications:  .  atorvastatin (LIPITOR) 20 MG tablet, Take 1 tablet (20 mg total) by mouth daily., Disp: 90 tablet, Rfl: 1 .  Vitamin D, Ergocalciferol, (DRISDOL) 1.25 MG (50000 UT) CAPS capsule, Take 1 capsule (50,000 Units total)  by mouth every 7 (seven) days for 12 doses., Disp: 12 capsule, Rfl: 0  Review of Systems:  Constitutional: Denies fever, chills, diaphoresis, appetite change and fatigue.  HEENT: Denies photophobia, eye pain, redness, hearing loss, ear pain, congestion, sore throat, rhinorrhea, sneezing, mouth sores, trouble swallowing, neck pain, neck stiffness and tinnitus.   Respiratory: Denies SOB, DOE, cough, chest tightness,  and wheezing.   Cardiovascular: Denies chest pain, palpitations and leg swelling.  Gastrointestinal: Denies nausea, vomiting, abdominal pain, diarrhea, constipation, blood in stool and abdominal distention.  Genitourinary: Denies dysuria, urgency, frequency, hematuria, flank pain and difficulty urinating.  Endocrine: Denies: hot or cold intolerance, sweats, changes in hair or nails, polyuria, polydipsia. Musculoskeletal: Denies myalgias, back pain, joint swelling, arthralgias and gait problem.  Skin: Denies pallor, rash and wound.  Neurological: Denies dizziness, seizures, syncope, weakness, light-headedness, numbness and headaches.  Hematological: Denies adenopathy. Easy bruising, personal or family bleeding history  Psychiatric/Behavioral: Denies suicidal ideation, mood changes, confusion, nervousness, sleep disturbance and agitation    Physical Exam: Vitals:   11/26/18 1109  BP: 110/70  Pulse: 65  Temp: 98.3 F (36.8 C)  TempSrc: Temporal  SpO2: 97%  Weight: 152 lb 9.6 oz (69.2 kg)  Height: 5' (1.524 m)   Body mass index is 29.8 kg/m.  Constitutional: NAD, calm, comfortable Eyes: PERRL, lids and conjunctivae normal, wears corrective lenses  ENMT: Mucous membranes are moist. Respiratory: clear to auscultation bilaterally, no wheezing, no crackles. Normal respiratory effort. No accessory muscle use.  Cardiovascular: Regular rate and rhythm, no murmurs / rubs / gallops. No extremity edema. 2+ pedal pulses.  Abdomen: no tenderness, no masses palpated. No  hepatosplenomegaly. Bowel sounds positive.  Musculoskeletal: no clubbing / cyanosis. No joint deformity upper and lower extremities. Good ROM, no contractures. Normal muscle tone.  Skin: no rashes, lesions, ulcers. No induration Neurologic: Grossly intact and nonfocal Psychiatric: Normal judgment and insight. Alert and oriented x 3. Normal mood.    Impression and Plan:  Screening for osteoporosis  - Plan: DG BONE DENSITY (DXA)  Hyperlipidemia, unspecified hyperlipidemia type -New diagnosis, LDL is above 170. -Start Lipitor 20 mg daily, recheck fasting lipids in 6 months.  Transaminitis -Mild, suspect fatty liver disease from obesity and hyperlipidemia. -Start statin, recheck LFTs in 3 months.  Leg fatigue  - Plan: Ambulatory referral to Physical Therapy  Vitamin D deficiency -Start high-dose vitamin D supplementation for 12 weeks with follow-up levels at completion.     Patient Instructions  -Nice seeing you today!!  -START Vit D 1 tab weekly for 12 weeks then return to have your levels rechecked.  -START lipitor 20 mg daily to lower your cholesterol.  -Schedule follow up in 4 months.     Lelon Frohlich, MD [facility]

## 2018-11-30 ENCOUNTER — Ambulatory Visit (INDEPENDENT_AMBULATORY_CARE_PROVIDER_SITE_OTHER)
Admission: RE | Admit: 2018-11-30 | Discharge: 2018-11-30 | Disposition: A | Discharge status: Home / Self Care | Payer: 59 | Source: Ambulatory Visit | Attending: Internal Medicine | Admitting: Internal Medicine

## 2018-11-30 ENCOUNTER — Other Ambulatory Visit: Payer: Self-pay

## 2018-11-30 DIAGNOSIS — Z780 Asymptomatic menopausal state: Secondary | ICD-10-CM | POA: Diagnosis not present

## 2018-11-30 DIAGNOSIS — Z13820 Encounter for screening for osteoporosis: Secondary | ICD-10-CM

## 2018-12-03 ENCOUNTER — Ambulatory Visit: Payer: 59 | Admitting: Physical Therapy

## 2018-12-24 ENCOUNTER — Other Ambulatory Visit: Payer: Self-pay

## 2018-12-24 ENCOUNTER — Ambulatory Visit
Admission: RE | Admit: 2018-12-24 | Discharge: 2018-12-24 | Disposition: A | Discharge status: Home / Self Care | Payer: 59 | Source: Ambulatory Visit | Attending: Obstetrics & Gynecology | Admitting: Obstetrics & Gynecology

## 2018-12-24 DIAGNOSIS — Z1231 Encounter for screening mammogram for malignant neoplasm of breast: Secondary | ICD-10-CM

## 2018-12-28 ENCOUNTER — Other Ambulatory Visit: Payer: Self-pay

## 2018-12-28 ENCOUNTER — Ambulatory Visit: Payer: 59 | Attending: Internal Medicine | Admitting: Physical Therapy

## 2018-12-28 ENCOUNTER — Encounter: Payer: Self-pay | Admitting: Physical Therapy

## 2018-12-28 DIAGNOSIS — R2689 Other abnormalities of gait and mobility: Secondary | ICD-10-CM | POA: Insufficient documentation

## 2018-12-28 DIAGNOSIS — M6281 Muscle weakness (generalized): Secondary | ICD-10-CM

## 2018-12-28 NOTE — Therapy (Addendum)
[facility] [address] [place], [place], [address] Phone: [phone]   Fax:  [phone]  Physical Therapy Evaluation  Patient Details  Name: [name] [name] MRN: [id number] Date of Birth: 01-Jun-1955 Referring Provider (PT): Dr. Lelon Frohlich   Encounter Date: 12/28/2018  PT End of Session - 12/28/18 1654    Visit Number  1    Date for PT Re-Evaluation  02/08/19    PT Start Time  1625   came late   PT Stop Time  1655    PT Time Calculation (min)  30 min    Activity Tolerance  Patient tolerated treatment well    Behavior During Therapy  [facility] for tasks assessed/performed       Past Medical History:  Diagnosis Date  . Hyperlipidemia   . Transaminitis   . Vitamin D deficiency     Past Surgical History:  Procedure Laterality Date  . GALLBLADDER SURGERY    . TONSILLECTOMY AND ADENOIDECTOMY      There were no vitals filed for this visit.   Subjective Assessment - 12/28/18 1629    Subjective  Patient reports she drags her left leg with walking. Patient has had this issue for 15 years.    Patient Stated Goals  increased strength of left leg    Currently in Pain?  No/denies         [facility] PT Assessment - 12/28/18 0001      Assessment   Medical Diagnosis  R29.898 Leg fatique    Referring Provider (PT)  Dr. Lelon Frohlich    Onset Date/Surgical Date  --   chronic   Prior Therapy  none      Precautions   Precautions  None      Restrictions   Weight Bearing Restrictions  No      Balance Screen   Has the patient fallen in the past 6 months  No    Has the patient had a decrease in activity level because of a fear of falling?   No    Is the patient reluctant to leave their home because of a fear of falling?   No      Home Film/video editor residence      Prior Function   Level of Independence  Independent      Cognition   Overall Cognitive Status   Within Functional Limits for tasks assessed      Posture/Postural Control   Posture/Postural Control  No significant limitations      ROM / Strength   AROM / PROM / Strength  AROM;PROM;Strength      AROM   Overall AROM   Within functional limits for tasks performed    Overall AROM Comments  full lumbar ROM with no pain      Strength   Overall Strength Comments  --    Left Hip Extension  4/5    Left Knee Extension  4/5      Palpation   SI assessment   left ilium is posteriorly rotated    Palpation comment  tenderness located in left gluteal and gluteius medius and left calf, tenderness located on the lateral left foot and below the lateral malleolus      Ambulation/Gait   Ambulation/Gait  Yes    Gait Pattern  Decreased step length - left;Decreased dorsiflexion - left   limp on the left  Objective measurements completed on examination: See above findings.              PT Education - 12/28/18 1646    Education Details  Access Code: [email or web address]; gave in [place]    Person(s) Educated  Patient    Methods  Explanation;Demonstration;Verbal cues;Handout    Comprehension  Verbalized understanding;Returned demonstration       PT Short Term Goals - 12/28/18 1701      PT SHORT TERM GOAL #1   Title  independent with initial HEP    Time  3    Period  Weeks    Status  New    Target Date  01/18/19        PT Long Term Goals - 12/28/18 1701      PT LONG TERM GOAL #1   Title  independent with advanced HEP    Time  3    Period  Weeks    Status  New    Target Date  01/18/19      PT LONG TERM GOAL #2   Title  ambulate without a limp on the right due to improved strength in left LE    Time  6    Period  Weeks    Status  New    Target Date  02/08/19      PT LONG TERM GOAL #3   Title  tightness in the left leg decreased >/= 75% due to improved flexibility    Time  6    Period  Weeks    Status  New    Target Date  02/08/19              Plan - 12/28/18 1655    Clinical Impression Statement  Patient reports for 15 years she has had difficulty with walking due to left leg drags. Patient reports no pain. Patient reports she has had difficulty with her left leg since she had surgery on her veins on left leg. Patient has tenderness located on left gluteal, left gastroc and lateral left foot. Patient left hip extension, left knee extension, and left plantarflexion 4/5.Patient reports no numbness or tingling in left leg. When patient walks you can see alimp on the left and at times will drag her left foot. Patient will benefit from skilled therapy to improve flexibility and strength of left leg to improve walking.    Examination-Activity Limitations  Locomotion Level    Examination-Participation Restrictions  Community Activity    Stability/Clinical Decision Making  Stable/Uncomplicated    Clinical Decision Making  Low    Rehab Potential  Good    PT Frequency  1x / week    PT Duration  6 weeks    PT Treatment/Interventions  Gait training;Therapeutic exercise;Therapeutic activities;Patient/family education;Neuromuscular re-education;Manual techniques    PT Next Visit Plan  correct left ilium, stretches to left LE, work on gait, nustep, work on left hip extension, left ankle PF, and left knee extension    PT Home Exercise Plan  Access Code: [email or web address] in [place] and Agree with Plan of Care  Patient       Patient will benefit from skilled therapeutic intervention in order to improve the following deficits and impairments:  Difficulty walking, Decreased endurance, Decreased strength  Visit Diagnosis: Muscle weakness (generalized)  Other abnormalities of gait and mobility     Problem List Patient Active Problem List   Diagnosis Date Noted  . Hyperlipidemia   . Transaminitis   .  Vitamin D deficiency     Earlie Counts, PT 12/28/18 5:04 PM   [facility] [address], [place], [place], [address] Phone: [phone]   Fax:  [phone]  Name: [name] [name] MRN: [id number] Date of Birth: April 22, 1955 PHYSICAL THERAPY DISCHARGE SUMMARY  Visits from Start of Care: 1  Current functional level related to goals / functional outcomes: See above.     Remaining deficits: See above.    Education / Equipment: HEP given in [place].  Plan: Patient agrees to discharge.  Patient goals were not met. Patient is being discharged due to the patient's request.  Thank you for the referral. Earlie Counts, PT 12/30/18 8:39 AM  ?????

## 2018-12-28 NOTE — Patient Instructions (Signed)
Access Code: [email or web address]  URL: [email or web address]  Date: 12/28/2018  Prepared by: Earlie Counts   Exercises Supine Single Knee to Chest Stretch - 2 reps - 1 sets - 30 sec hold                            - 1x daily - 7x weekly Supine Piriformis Stretch with Foot on Ground - 2 reps - 1 sets - 30 sec hold - 1x daily - 7x weekly Supine Hamstring Stretch - 2 reps - 1 sets - 30 sec hold - 1x daily - 7x weekly Gastroc Stretch on Wall - 2 reps - 1 sets - 30 sec hold - 1x daily - 7x weekly [facility] [address], [place],  [address] Phone # [phone] Fax [phone]

## 2019-01-14 ENCOUNTER — Encounter: Payer: 59 | Admitting: Physical Therapy

## 2019-01-21 ENCOUNTER — Encounter: Payer: 59 | Admitting: Physical Therapy

## 2019-01-28 ENCOUNTER — Encounter: Payer: 59 | Admitting: Physical Therapy

## 2019-02-19 ENCOUNTER — Ambulatory Visit: Payer: No Typology Code available for payment source | Attending: Internal Medicine

## 2019-02-19 ENCOUNTER — Other Ambulatory Visit: Payer: Self-pay | Admitting: Internal Medicine

## 2019-02-19 DIAGNOSIS — E559 Vitamin D deficiency, unspecified: Secondary | ICD-10-CM

## 2019-02-19 DIAGNOSIS — Z20822 Contact with and (suspected) exposure to covid-19: Secondary | ICD-10-CM

## 2019-02-21 LAB — NOVEL CORONAVIRUS, NAA: SARS-CoV-2, NAA: NOT DETECTED

## 2019-03-30 ENCOUNTER — Ambulatory Visit: Payer: 59 | Admitting: Internal Medicine

## 2019-09-13 ENCOUNTER — Telehealth: Payer: Self-pay | Admitting: Internal Medicine

## 2019-09-13 ENCOUNTER — Encounter: Payer: Self-pay | Admitting: Family Medicine

## 2019-09-13 ENCOUNTER — Ambulatory Visit: Payer: No Typology Code available for payment source | Admitting: Family Medicine

## 2019-09-13 ENCOUNTER — Other Ambulatory Visit: Payer: Self-pay

## 2019-09-13 VITALS — BP 118/70 | HR 66 | Temp 98.2°F | Wt 152.4 lb

## 2019-09-13 DIAGNOSIS — M5432 Sciatica, left side: Secondary | ICD-10-CM

## 2019-09-13 DIAGNOSIS — S76012A Strain of muscle, fascia and tendon of left hip, initial encounter: Secondary | ICD-10-CM

## 2019-09-13 MED ORDER — DICLOFENAC SODIUM 75 MG PO TBEC: 75.0000 mg | DELAYED_RELEASE_TABLET | Freq: Two times a day (BID) | ORAL | 0 refills | Status: DC

## 2019-09-13 NOTE — Progress Notes (Signed)
° °  Subjective:    Patient ID: [name] [name], female    DOB: [birth year], 64 y.o.   MRN: [id number]  HPI Here with her husband to discuss several types of symptoms. About 9 years ago she developed what she calls a "lazy leg" on the left side. She feels a slight weakness in the leg, and sometimes as she is walking she feels like she is twisting her hips to swing the leg forward instead of moving it normally. No giving way or falling. No numbness in the leg or foot. She was diagnosed with sciatica in the right lower back and leg some years ago when she was having a lot of pain. Fortunately this resolved. She now feels some mild pain in the left buttock that radiates down the leg. She tries to avoid taking anything OTC for this. Now in the past 2 weeks she has also had a different pain in the left groin area. This is sharper and she has more of this pain when she is walking.    Review of Systems  Constitutional: Negative.   Respiratory: Negative.   Cardiovascular: Negative.   Musculoskeletal: Positive for arthralgias and gait problem.  Neurological: Positive for weakness. Negative for numbness.       Objective:   Physical Exam Constitutional:      Appearance: Normal appearance.     Comments: She walks normally down the hall   Cardiovascular:     Rate and Rhythm: Normal rate and regular rhythm.     Pulses: Normal pulses.     Heart sounds: Normal heart sounds.  Pulmonary:     Effort: Pulmonary effort is normal.     Breath sounds: Normal breath sounds.  Abdominal:     General: Abdomen is flat. Bowel sounds are normal. There is no distension.     Palpations: Abdomen is soft. There is no mass.     Tenderness: There is no abdominal tenderness. There is no guarding or rebound.     Hernia: No hernia is present.  Musculoskeletal:     Comments: She is mildly tender in the left lower back and over the left sciatic notch. The spine has full ROM. Negative SLR. The left hip has full ROM but she is  very tender over the insertion of the left hip flexor onto the pelvis.   Neurological:     Mental Status: She is alert.           Assessment & Plan:  She has 2 issues, one is chronic left sided sciatica, and also now she has an acute left hip flexor strain. She will rest and try Diclofenac BID. If this is not successful, we may consider PT.  Gershon Crane, MD

## 2019-09-13 NOTE — Telephone Encounter (Signed)
Patient is wanting to switch to Dr. Clent Ridges.  Is this ok with both providers?

## 2019-09-14 NOTE — Telephone Encounter (Signed)
OK with me also.  ?

## 2019-09-14 NOTE — Telephone Encounter (Signed)
Ok with me 

## 2019-09-17 NOTE — Telephone Encounter (Signed)
Scheduled TOC appt with pt. °

## 2019-09-21 ENCOUNTER — Other Ambulatory Visit: Payer: Self-pay

## 2019-09-21 ENCOUNTER — Ambulatory Visit: Payer: No Typology Code available for payment source | Admitting: Family Medicine

## 2019-09-21 ENCOUNTER — Encounter: Payer: Self-pay | Admitting: Family Medicine

## 2019-09-21 VITALS — BP 122/64 | HR 76 | Temp 98.7°F | Wt 153.0 lb

## 2019-09-21 DIAGNOSIS — R29898 Other symptoms and signs involving the musculoskeletal system: Secondary | ICD-10-CM | POA: Diagnosis not present

## 2019-09-21 DIAGNOSIS — E785 Hyperlipidemia, unspecified: Secondary | ICD-10-CM | POA: Diagnosis not present

## 2019-09-21 MED ORDER — ATORVASTATIN CALCIUM 20 MG PO TABS: 20.0000 mg | ORAL_TABLET | Freq: Every day | ORAL | 3 refills | Status: DC

## 2019-09-21 NOTE — Progress Notes (Signed)
   Subjective:    Patient ID: [name] [name], female    DOB: [birth year], 64 y.o.   MRN: [id number]  HPI Here to establish care after transferring from Dr. Ardyth Harps. I saw her on 09-13-19 for 2 issues, one being acute pain in the left groin and the other being chronic weakness in the left leg. We felt she had an acute groin strain and gave her Diclofenac to take. This has worked quite well and today she is pain free. However the leg weakness persists. She had one visit to [facility] last November but she was not happy with this visit, so she never went back. Otherwise she seems to be in good health. She takes Lipitor for high cholesterol. Her last complete physical was on 11-26-18. She had a well GYN exam with Dr. Seymour Bars last September. She gets yearly mammograms and she had a bone density exam last October. She had a colonoscopy with Dr. Karena Addison on [address] in [place] [phone]) sometime last year and she had a single polyp removed. She is not sure what type polyp this was or when a follow up was recommended.    Review of Systems  Constitutional: Negative.   Respiratory: Negative.   Cardiovascular: Negative.   Gastrointestinal: Negative.   Genitourinary: Negative.   Musculoskeletal: Positive for arthralgias.  Neurological: Positive for weakness. Negative for numbness.       Objective:   Physical Exam Constitutional:      Appearance: Normal appearance.  Cardiovascular:     Rate and Rhythm: Normal rate and regular rhythm.     Pulses: Normal pulses.     Heart sounds: Normal heart sounds.  Pulmonary:     Effort: Pulmonary effort is normal.     Breath sounds: Normal breath sounds.  Neurological:     General: No focal deficit present.     Mental Status: She is alert and oriented to person, place, and time.           Assessment & Plan:  Intro visit. Her left groin strain has resolved. She has chronic left leg weakness so we will refer her to Sports Medicine to  evaluate and treat this. Her dyslipidemia seems to be stable. We will plan on giving her another well exam with fasting labs in late October. We will arrange to have her GI records sent to [place] so we can get more details about her colonoscopy.  Gershon Crane, MD

## 2019-09-29 ENCOUNTER — Ambulatory Visit: Payer: No Typology Code available for payment source | Admitting: Family Medicine

## 2019-09-29 ENCOUNTER — Ambulatory Visit (INDEPENDENT_AMBULATORY_CARE_PROVIDER_SITE_OTHER): Payer: No Typology Code available for payment source

## 2019-09-29 ENCOUNTER — Other Ambulatory Visit: Payer: Self-pay

## 2019-09-29 ENCOUNTER — Encounter: Payer: Self-pay | Admitting: Family Medicine

## 2019-09-29 VITALS — BP 130/70 | HR 68 | Ht 60.0 in | Wt 151.4 lb

## 2019-09-29 DIAGNOSIS — M25552 Pain in left hip: Secondary | ICD-10-CM

## 2019-09-29 DIAGNOSIS — M5416 Radiculopathy, lumbar region: Secondary | ICD-10-CM

## 2019-09-29 NOTE — Progress Notes (Signed)
Subjective:    I'm seeing this patient as a consultation for:  Dr. Clent Ridges. Note will be routed back to referring provider/PCP.  CC: L leg weakness  I, Molly Weber, LAT, ATC, am serving as scribe for Dr. Clementeen Graham.  HPI: Pt is a 64 y/o female presenting w/ c/o progressively worsening L leg weakness over the past 10 years.  She states that she decided to try and walk to help w/ the weakness that then led to pain in her L leg that went from her low back to her L knee.  However, she is no longer having the pain and is only noticing weakness in her L leg.  Low back pain: yes L LE numbness/tingling: No Aggravating factors: walking; prolonged sitting Treatments tried: oral Voltaren;   Diagnostic testing: L-spine XR- 01/23/05  Past medical history, Surgical history, Family history, Social history, Allergies, and medications have been entered into the medical record, reviewed.   Review of Systems: No new headache, visual changes, nausea, vomiting, diarrhea, constipation, dizziness, abdominal pain, skin rash, fevers, chills, night sweats, weight loss, swollen lymph nodes, body aches, joint swelling, muscle aches, chest pain, shortness of breath, mood changes, visual or auditory hallucinations.   Objective:    Vitals:   09/29/19 1601  BP: 130/70  Pulse: 68  SpO2: 96%   General: Well Developed, well nourished, and in no acute distress.  Neuro/Psych: Alert and oriented x3, extra-ocular muscles intact, able to move all 4 extremities, sensation grossly intact. Skin: Warm and dry, no rashes noted.  Respiratory: Not using accessory muscles, speaking in full sentences, trachea midline.  Cardiovascular: Pulses palpable, no extremity edema. Abdomen: Does not appear distended. MSK: L-spine Normal-appearing Nontender. Normal motion to flexion rotation and lateral flexion.  Decreased extension. Lower extremity reflexes and sensation are equal normal throughout bilateral extremities. Lower  extremity strength decreased to left foot dorsiflexion and left hip abduction mildly.  Otherwise normal and equal bilaterally. Mildly positive slump test left side.  Left hip normal. Normal motion. Mildly tender palpation greater trochanter. Hip abduction strength mildly diminished 4/5.   Lab and Radiology Results  X-ray images lumbar spine and hip obtained today personally and independently reviewed  L-spine: DDD and facet DJD most prominent at L5-S1.  Left hip: No significant degenerative changes.  Lucency present iliac crest left  Await formal radiology review  Impression and Recommendations:    Assessment and Plan: 64 y.o. female with lumbar radiculopathy involving L4 nerve root causing a little bit of weakness to foot dorsiflexion as well as lateral hip pain thought to be due to trochanteric bursitis/hip abductor tendinopathy present.  Plan to treat with physical therapy..  X-rays obtained today.  If not significantly improving will proceed to MRI of L-spine.  Recheck back in about 4 weeks.  Visit conducted using Spanish interpreter.   Orders Placed This Encounter  Procedures  . DG Hip Unilat W OR W/O Pelvis 2-3 Views Left    Standing Status:   Future    Number of Occurrences:   1    Standing Expiration Date:   09/28/2020    Order Specific Question:   Reason for Exam (SYMPTOM  OR DIAGNOSIS REQUIRED)    Answer:   eval left lateral hip pain    Order Specific Question:   Preferred imaging location?    Answer:   [facility]    Order Specific Question:   Radiology Contrast Protocol - do NOT remove file path    Answer:   \\  [email or web address]  . DG Lumbar Spine 2-3 Views    Standing Status:   Future    Number of Occurrences:   1    Standing Expiration Date:   09/28/2020    Order Specific Question:   Reason for Exam (SYMPTOM  OR DIAGNOSIS REQUIRED)    Answer:   eval poss L5 radic left    Order Specific Question:   Preferred  imaging location?    Answer:   [facility]    Order Specific Question:   Radiology Contrast Protocol - do NOT remove file path    Answer:   \\[email or web address]  . Ambulatory referral to Physical Therapy    Referral Priority:   Routine    Referral Type:   Physical Medicine    Referral Reason:   Specialty Services Required    Requested Specialty:   Physical Therapy   No orders of the defined types were placed in this encounter.   Discussed warning signs or symptoms. Please see discharge instructions. Patient expresses understanding.   The above documentation has been reviewed and is accurate and complete Clementeen Graham, M.D.

## 2019-09-29 NOTE — Patient Instructions (Signed)
Thank you for coming in today. Get xray today.  Return in 4 weeks.  Plan for physical therapy.    Bursitis en la cadera Hip Bursitis  La bursitis en la cadera es la hinchazn de un saco lleno de lquido (bolsa sinovial) en la articulacin de la cadera. Esta hinchazn (inflamacin) puede ser dolorosa. Esta afeccin puede aparecer y Geneticist, molecular a lo largo del tiempo. Cules son las causas?  Lesin en la cadera.  Uso excesivo de los msculos que rodean la articulacin de la cadera.  Una lesin o ciruga anterior de la cadera.  Artritis o gota.  Diabetes.  Enfermedad tiroidea.  Infeccin.  En algunos casos, es posible que la causa se desconozca. Cules son los signos o los sntomas?  Dolor leve o moderado en la zona de la cadera. El dolor puede intensificarse con el movimiento.  Dolor con la palpacin e hinchazn de la cadera, especialmente del lado externo.  En casos poco frecuentes, la bolsa sinovial puede infectarse. Esto puede causar lo siguiente: ? [place]. ? Calor o enrojecimiento en la zona. Los sntomas pueden aparecer y Geneticist, molecular. Cmo se trata? El tratamiento de esta afeccin incluye hacer reposo, aplicar hielo, aplicar presin (compresin), levantar (elevar) la zona lesionada. Es posible que escuche que a este tratamiento se lo denomina RHCE (Reposo, hielo, compresin, elevacin). El tratamiento tambin puede incluir lo siguiente:  El uso de [place].  Extraccin de lquido de la bolsa sinovial para Technical sales engineer hinchazn.  La aplicacin de una inyeccin de un medicamento que ayuda a reducir la inflamacin (cortisona).  Otros medicamentos si la bolsa sinovial est infectada. Siga estas instrucciones en su casa: Control del dolor, la rigidez y la hinchazn   Si se lo indican, aplique hielo sobre la zona dolorida. ? Ponga el hielo en una bolsa plstica. ? Coloque una [employer] piel y Copy. ? Coloque el hielo durante , 2 a 3veces por  da. ? Levante (eleve) la cadera por encima del nivel del corazn tanto como sea posible sin que Stage manager. Para hacerlo, intente colocar una almohada debajo de las caderas mientras est [place]. Si esto le causa dolor, deje de hacerlo. Actividad  Retome sus actividades habituales como se lo haya indicado el mdico. Pregntele al mdico qu actividades son seguras para usted.  Haga reposo y Pharmacist, community la cadera tanto como pueda hasta que se sienta mejor. Instrucciones generales  [employer] de venta libre y los recetados solamente como se lo haya indicado el mdico.  Use vendas que le compriman la cadera (vendas de compresin) solamente como se lo haya indicado el mdico.  No apoye el peso del cuerpo sobre la cadera, hasta tanto el mdico lo autorice.  Utilice las [employer] se lo haya indicado el mdico.  [place] y estire suavemente la zona de la lesin tan frecuentemente como le resulte cmodo.  Concurra a todas las visitas de [address] se lo haya indicado el mdico. Esto es importante. Cmo se evita?  Haga ejercicios con regularidad como se lo haya indicado el mdico.  Precaliente y elongue adecuadamente antes de hacer actividad fsica.  Reljese y elongue despus de hacer actividad fsica.  Evite las [employer] le causan molestias o dolor en la cadera.  Evite estar sentado [employer]. Comunquese con un mdico si:  Tiene fiebre.  Tiene sntomas nuevos.  Presenta dificultad para caminar.  Tiene dificultad para [employer] cotidianas.  Su dolor empeora.  Su dolor no se alivia con medicamentos.  Se le enrojece la piel de la zona de la cadera.  Tiene sensacin de calor en la zona de la cadera. Solicite ayuda inmediatamente si:  No puede mover la cadera.  Siente Scientific laboratory technician. Resumen  La bursitis en la cadera es la hinchazn de un saco lleno de lquido (bolsa sinovial) en la cadera.  La bursitis en la cadera puede  ser dolorosa.  Los sntomas suelen aparecer y Geneticist, molecular con Museum/gallery conservator.  Esta afeccin se trata con reposo, hielo, compresin, elevacin y medicamentos. Esta informacin no tiene Theme park manager el consejo del mdico. Asegrese de hacerle al mdico cualquier pregunta que tenga. Document Revised: 10/23/2017 Document Reviewed: 10/23/2017 Elsevier Patient Education  2020 [employer].   Dolor radicular Radicular Pain El dolor radicular es un tipo de dolor que se extiende desde la espalda o el cuello a lo largo del nervio raqudeo. Los nervios raqudeos se originan en la mdula espinal y llegan a los msculos. El dolor radicular a veces se conoce como radiculopata, radiculitis o pinzamiento de un nervio. Si tiene este tipo de Engineer, mining, tambin puede sentir debilidad, adormecimiento u hormigueo en la zona del cuerpo a la que llega ese nervio. El dolor puede ser agudo y sentirse como un ardor. Segn el nervio raqudeo que est afectado, el dolor puede aparecer en los siguientes lugares:  Zona del cuello (dolor radicular cervical). Tambin puede Financial risk analyst, adormecimiento, debilidad u hormigueo [employer].  Zona de la mitad de la columna (dolor radicular torcico). Se siente en la espalda y en el pecho. Este tipo es poco frecuente.  Zona inferior de la espalda (dolor radicular lumbar). Se siente un dolor lumbar. Tambin puede Financial risk analyst, adormecimiento, debilidad u hormigueo en las nalgas o en las piernas. La citica es un tipo de dolor radicular lumbar que se extiende por la parte de atrs de la pierna. El dolor radicular se produce cuando uno de los nervios espinales se irrita o se aprieta (comprime). A menudo se debe a algo que ejerce presin sobre un nervio espinal, como uno de los huesos de la columna vertebral (vrtebras) o una de las almohadillas redondas que estn entre las vrtebras (discos intervertebrales). Esto puede deberse a lo siguiente:  Una lesin.  El desgaste o el  envejecimiento de un disco.  El crecimiento de un espoln seo que aprieta el [place]. El dolor radicular suele desaparecer cuando se siguen las indicaciones del mdico sobre cmo aliviarlo en la casa. Siga estas indicaciones en su casa: Control del dolor      Si se lo indican, aplique hielo sobre la zona afectada: ? Ponga el hielo en una bolsa plstica. ? Coloque una [employer] piel y Copy. ? Coloque el hielo durante , 2 a 3veces por da.  Si se lo indican, aplique calor en la zona afectada con la frecuencia que le haya indicado el mdico. Use la fuente de calor que el mdico le recomiende, como una compresa de calor hmedo o una almohadilla trmica. ? Coloque una [employer] piel y la fuente de Airline pilot. ? Aplique calor durante 20 a . ? Retire la fuente de calor si la piel se pone de color rojo brillante. Esto es especialmente importante si no puede sentir dolor, calor o fro. Puede correr un riesgo mayor de sufrir quemaduras. Actividad   No permanezca sentado o acostado durante largos perodos.  Intente mantenerse lo ms activo posible. Pregntele al mdico qu tipo de ejercicio o actividad es mejor para usted.  Evite las actividades que empeoren el dolor, como doblarse y Lexicographer objetos.  No levante ningn objeto que pese ms de 10libras (4,5kg) o el lmite de peso que le hayan indicado, hasta que el mdico le diga que puede [place].  Use una tcnica adecuada al levantar objetos. La tcnica adecuada para levantar objetos consiste en doblar las rodillas y levantarse.  Haga ejercicios de fuerza y flexibilidad solamente como se lo haya indicado el mdico o el fisioterapeuta. Indicaciones generales  [employer] de venta libre y los recetados solamente como se lo haya indicado el mdico.  Est atento a cualquier [employer] sntomas.  Concurra a todas las visitas de [address] se lo haya indicado el mdico. Esto es  importante. ? El mdico lo puede enviar a un fisioterapeuta para Electrical engineer. Comunquese con un mdico si:  El dolor y otros sntomas empeoran.  El medicamento no IT trainer.  El dolor no mejora despus de unas semanas de cuidado Facilities manager.  Tiene fiebre. Solicite ayuda inmediatamente si:  Licensed conveyancer, adormecimiento o debilidad intensos.  Tiene dificultad con el control de la vejiga o los intestinos. Resumen  El dolor radicular es un tipo de dolor que se extiende desde la espalda o el cuello a lo largo del nervio raqudeo.  Si tiene dolor radicular, tambin puede sentir debilidad, adormecimiento u hormigueo en la zona del cuerpo a la que llega ese nervio.  El dolor puede ser agudo o sentirse como ardor.  El dolor radicular puede tratarse con hielo, calor, medicamentos o fisioterapia. Esta informacin no tiene Theme park manager el consejo del mdico. Asegrese de hacerle al mdico cualquier pregunta que tenga. Document Revised: 10/01/2017 Document Reviewed: 10/01/2017 Elsevier Patient Education  2020 [employer].

## 2019-09-30 ENCOUNTER — Telehealth: Payer: Self-pay | Admitting: Family Medicine

## 2019-09-30 ENCOUNTER — Encounter: Payer: Self-pay | Admitting: Family Medicine

## 2019-09-30 NOTE — Telephone Encounter (Signed)
The [facility] called with a call report for the patient's DG HIP/PELVIS that they are concerned about.  IMPRESSION: Mild symmetric narrowing of each hip joint. No fracture or dislocation.  Lucent area in the left iliac crest measuring 2.5 x 1.5 cm. Etiology for this lesion is uncertain. The margins of this lesion are smooth and well-circumscribed suggesting benign etiology. It may be prudent to consider nuclear medicine bone scan to assess for abnormal metabolic activity in this lesion.

## 2019-10-01 ENCOUNTER — Telehealth: Payer: Self-pay | Admitting: Family Medicine

## 2019-10-01 DIAGNOSIS — M899 Disorder of bone, unspecified: Secondary | ICD-10-CM

## 2019-10-01 NOTE — Telephone Encounter (Signed)
No pre cert required for bone scan sent patient mychart message with [place] number to call and schedule

## 2019-10-01 NOTE — Telephone Encounter (Signed)
Bone scan ordered to evaluate lucency seen on the x-ray

## 2019-10-01 NOTE — Progress Notes (Signed)
MRI hip does not show significant arthritis.  However there is an incidentally found lucency in the pelvis bone on the left side.  This measures 2.5 x 1.5 cm.  It does not look concerning for cancer but radiologist would like me to proceed with bone scan to further evaluate this is of we are certain were not dealing with anything bad.  I will arrange for the bone scan to be done in the near future.

## 2019-10-01 NOTE — Telephone Encounter (Signed)
Noted.  Have already ordered bone scan.

## 2019-10-01 NOTE — Progress Notes (Signed)
X-ray L-spine shows some narrowing at L2-3 and L4-5 and some facet arthritis at L5-S1.  This can cause of back pain and potentially pinched nerve down the leg and when we are worried about.  We'll give this a try with physical therapy and if not better MRI will tell us more.

## 2019-10-05 ENCOUNTER — Other Ambulatory Visit: Payer: Self-pay | Admitting: Family Medicine

## 2019-10-08 ENCOUNTER — Other Ambulatory Visit: Payer: Self-pay

## 2019-10-08 ENCOUNTER — Encounter (HOSPITAL_COMMUNITY)
Admission: RE | Admit: 2019-10-08 | Discharge: 2019-10-08 | Disposition: A | Discharge status: Home / Self Care | Payer: No Typology Code available for payment source | Source: Ambulatory Visit | Attending: Family Medicine | Admitting: Family Medicine

## 2019-10-08 ENCOUNTER — Ambulatory Visit (HOSPITAL_COMMUNITY)
Admission: RE | Admit: 2019-10-08 | Discharge: 2019-10-08 | Disposition: A | Discharge status: Home / Self Care | Payer: No Typology Code available for payment source | Source: Ambulatory Visit | Attending: Family Medicine | Admitting: Family Medicine

## 2019-10-08 DIAGNOSIS — M899 Disorder of bone, unspecified: Secondary | ICD-10-CM | POA: Diagnosis present

## 2019-10-08 MED ORDER — TECHNETIUM TC 99M MEDRONATE IV KIT
20.0000 | PACK | Freq: Once | INTRAVENOUS | Status: AC | PRN
  Administered 2019-10-08: 20 via INTRAVENOUS

## 2019-10-11 NOTE — Progress Notes (Signed)
Bone scan looks normal. No evidence of cancer on the scan.

## 2019-10-27 ENCOUNTER — Other Ambulatory Visit: Payer: Self-pay

## 2019-10-27 ENCOUNTER — Ambulatory Visit: Payer: No Typology Code available for payment source | Admitting: Family Medicine

## 2019-10-27 VITALS — BP 110/72 | HR 64 | Ht 60.0 in | Wt 151.0 lb

## 2019-10-27 DIAGNOSIS — M25552 Pain in left hip: Secondary | ICD-10-CM | POA: Diagnosis not present

## 2019-10-27 DIAGNOSIS — M5416 Radiculopathy, lumbar region: Secondary | ICD-10-CM | POA: Diagnosis not present

## 2019-10-27 NOTE — Patient Instructions (Signed)
Thank you for coming in today. Please call for physical therapy.  Let me know if you a problem scheduling.

## 2019-10-27 NOTE — Progress Notes (Signed)
Bruce Donath, am serving as a scribe for Dr. Clementeen Graham. This visit occurred during the SARS-CoV-2 public health emergency.  Safety protocols were in place, including screening questions prior to the visit, additional usage of staff PPE, and extensive cleaning of exam room while observing appropriate contact time as indicated for disinfecting solutions.   [name] [name] is a 64 y.o. female who presents to [facility] today for f/u of L leg weakness.  She was last seen by Dr. Denyse Amass on 09/29/19 and was referred to PT but has not completed any visits yet.  Since her last visit, pt reports that her pain is the same as last visit. Pain in left groin with standing and walking.   She wanted to talk to me about the result of the bone scan before proceeding with physical therapy.  Diagnostic testing: L-spine and L hip XR- 09/29/19  Pertinent review of systems: No fevers or chills  Relevant historical information: Hyperlipidemia   Exam:  BP 110/72   Pulse 64   Ht 5' (1.524 m)   Wt 151 lb (68.5 kg)   SpO2 96%   BMI 29.49 kg/m  General: Well Developed, well nourished, and in no acute distress.   MSK: Left hip normal-appearing normal motion. No significant pain with internal rotation or flexion. Strength is intact. L-spine normal-appearing nontender normal motion. Left foot dorsiflexion strength slightly diminished.    Lab and Radiology Results DG Lumbar Spine 2-3 Views  Result Date: 09/30/2019 CLINICAL DATA:  Low back pain EXAM: LUMBAR SPINE - 2-3 VIEW COMPARISON:  Pelvis radiograph September 30, 2019 FINDINGS: Frontal, lateral, and spot lumbosacral lateral images were obtained. There are 5 non-rib-bearing lumbar type vertebral bodies. There is no fracture or spondylolisthesis. There is slight disc space narrowing at L2-3 and L4-5. No erosive change. There is facet osteoarthritic change at L5-S1 bilaterally. A lucent area in the left iliac crest is described  in the pelvis radiograph report. IMPRESSION: Mild disc space narrowing at L2-3 and L4-5. Facet osteoarthritic change at L5-S1 bilaterally. No fracture or spondylolisthesis. Well-circumscribed lucent area in the left iliac bone is described in the pelvis radiograph report. Electronically Signed   By: Bretta Bang III M.D.   On: 09/30/2019 15:05   NM Bone Scan Whole Body  Result Date: 10/09/2019 CLINICAL DATA:  Lucent lesions seen over left iliac crest on a hip x-ray from September 29, 2019. EXAM: NUCLEAR MEDICINE WHOLE BODY BONE SCAN TECHNIQUE: Whole body anterior and posterior images were obtained approximately 3 hours after intravenous injection of radiopharmaceutical. RADIOPHARMACEUTICALS:  20.0 mCi Technetium-44m MDP IV COMPARISON:  Hip x-ray September 29, 2019 FINDINGS: Degenerative changes in the right fingers in the bilateral toes. No other abnormal uptake identified in the bones. Specifically, no abnormal uptake identified in the left iliac crest. Review of the hip x-ray suggests the lucency may have represented air within the descending colon superimposed over the iliac bone. Soft tissue uptake is normal. Mild degenerative changes in the lower lumbar spine to the right. No other abnormalities. IMPRESSION: No scintigraphic evidence of malignancy. No abnormal uptake in the left iliac crest to suggest a suspicious bony lesion. Electronically Signed   By: Gerome Sam III M.D   On: 10/09/2019 17:19   DG Hip Unilat W OR W/O Pelvis 2-3 Views Left  Result Date: 09/30/2019 CLINICAL DATA:  Pain EXAM: DG HIP (WITH OR WITHOUT PELVIS) 2-3V LEFT COMPARISON:  None. FINDINGS: Frontal pelvis as well as frontal and  lateral left hip images were obtained. No fracture or dislocation. There is mild symmetric narrowing of each hip joint. No erosive change. There is a lucent area in the left iliac bone measuring 2.5 x 1.5 cm. IMPRESSION: Mild symmetric narrowing of each hip joint. No fracture or dislocation. Lucent area  in the left iliac crest measuring 2.5 x 1.5 cm. Etiology for this lesion is uncertain. The margins of this lesion are smooth and well-circumscribed suggesting benign etiology. It may be prudent to consider nuclear medicine bone scan to assess for abnormal metabolic activity in this lesion. These results will be called to the ordering clinician or representative by the Radiologist Assistant, and communication documented in the PACS or [employer]. Electronically Signed   By: Bretta Bang III M.D.   On: 09/30/2019 15:04       Assessment and Plan: 64 y.o. female with lumbar radiculopathy and anterior hip pain.  Never did physical therapy.  Reordered physical therapy patient will call and schedule soon.  If not better with me MRI lumbar spine and possibly either diagnostic and injection or MRI hip.  Recheck back in 1 to 2 months.   PDMP not reviewed this encounter. Orders Placed This Encounter  Procedures  . Ambulatory referral to Physical Therapy    Referral Priority:   Routine    Referral Type:   Physical Medicine    Referral Reason:   Specialty Services Required    Requested Specialty:   Physical Therapy   No orders of the defined types were placed in this encounter.    Discussed warning signs or symptoms. Please see discharge instructions. Patient expresses understanding.   The above documentation has been reviewed and is accurate and complete Clementeen Graham, M.D.  Total encounter time 20 minutes including face-to-face time with the patient and charting on the date of service.   Visit conducted today using Spanish interpreter.

## 2019-11-26 ENCOUNTER — Encounter: Payer: No Typology Code available for payment source | Admitting: Family Medicine

## 2019-11-29 ENCOUNTER — Ambulatory Visit (INDEPENDENT_AMBULATORY_CARE_PROVIDER_SITE_OTHER): Payer: No Typology Code available for payment source | Admitting: Physical Therapy

## 2019-11-29 ENCOUNTER — Other Ambulatory Visit: Payer: Self-pay

## 2019-11-29 ENCOUNTER — Encounter: Payer: Self-pay | Admitting: Physical Therapy

## 2019-11-29 DIAGNOSIS — M6281 Muscle weakness (generalized): Secondary | ICD-10-CM

## 2019-11-29 DIAGNOSIS — M545 Low back pain, unspecified: Secondary | ICD-10-CM | POA: Diagnosis not present

## 2019-11-29 DIAGNOSIS — M25552 Pain in left hip: Secondary | ICD-10-CM

## 2019-11-29 DIAGNOSIS — G8929 Other chronic pain: Secondary | ICD-10-CM

## 2019-11-29 NOTE — Patient Instructions (Signed)
Access Code: [email or web address] URL: [email or web address] Date: 11/29/2019 Prepared by: Sedalia Muta  Exercises Supine Single Knee to Chest Stretch - 2 x daily - 3 reps - 30 hold Supine Piriformis Stretch Pulling Heel to Hip - 2 x daily - 3 reps - 30 hold Seated Piriformis Stretch with Trunk Bend - 2 x daily - 3 reps - 30 hold Sidelying Hip Abduction - 1 x daily - 1-2 sets - 10 reps Supine March - 2 x daily - 1 sets - 10 reps Supine Bridge - 1 x daily - 2 sets - 10 reps  Printed in spanish for patient

## 2019-11-30 NOTE — Therapy (Signed)
[facility] [address] [place], [place], [address] Phone: [phone]   Fax:  [phone]  Physical Therapy Evaluation  Patient Details  Name: [name] [name] MRN: [id number] Date of Birth: [birth year] Referring Provider (PT): Clementeen Graham   Encounter Date: 11/29/2019   PT End of Session - 11/29/19 1550    Visit Number 1    Number of Visits 12    Date for PT Re-Evaluation 01/10/20    Authorization Type Aetna    PT Start Time 1432    PT Stop Time 1515    PT Time Calculation (min) 43 min    Activity Tolerance Patient tolerated treatment well    Behavior During Therapy [facility] for tasks assessed/performed           Past Medical History:  Diagnosis Date  . Hyperlipidemia   . Transaminitis   . Vitamin D deficiency     Past Surgical History:  Procedure Laterality Date  . GALLBLADDER SURGERY    . TONSILLECTOMY AND ADENOIDECTOMY      There were no vitals filed for this visit.    Subjective Assessment - 11/29/19 1438    Subjective Increased pain in L thigh/leg with walking. Was walking up to 2 hours, pain got worse. States pain for a few years, but was increased lately. Also states chronic back pain. 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 Most pain was in L anteiror hip, thigh, into groin, worse with walking. States no pain in last copule weeks, but notes L Leg "dragging" behind R side and does not feel the same.    Limitations Standing;Walking;House hold activities    Patient Stated Goals make L leg feel like the R leg.    Currently in Pain? Yes    Pain Score 2     Pain Location Hip    Pain Orientation Left    Pain Descriptors / Indicators Aching    Pain Type Acute pain    Pain Onset More than a month ago    Pain Frequency Intermittent    Aggravating Factors  standing, walking              [facility] PT Assessment - 11/30/19 0001      Assessment   Medical Diagnosis L hip pain/ back pain/ gait    Referring Provider (PT) Clementeen Graham    Prior Therapy no      Balance Screen   Has the patient fallen in the past 6 months No      Prior Function   Level of Independence Independent      Cognition   Overall Cognitive Status Within Functional Limits for tasks assessed      ROM / Strength   AROM / PROM / Strength AROM;Strength      AROM   Overall AROM Comments HIp: WNL bil; Lumbar: mild limitations for flex, ext       Strength   Overall Strength Comments L Hip: 4-/5, R: 4/5 : knee: 5/5       Palpation   Palpation comment Soreness in L groin, pubic symphysis, greater trochanter, tenderness in L glute       Special Tests   Other special tests Neg fabir, fadir, Neg SLR.       Ambulation/Gait   Gait Comments mild limp, L LE with decreased hip flexion, decreased cadence on L and quickness of step compared to R.  Objective measurements completed on examination: See above findings.       [facility] Adult PT Treatment/Exercise - 11/30/19 0001      Exercises   Exercises Knee/Hip      Knee/Hip Exercises: Stretches   Other Knee/Hip Stretches SKTC 30 sec x3;     Other Knee/Hip Stretches Mod piriformis 30 sec x 2 bil;       Knee/Hip Exercises: Supine   Bridges 10 reps    Other Supine Knee/Hip Exercises Supine march x 20;       Knee/Hip Exercises: Sidelying   Hip ABduction 10 reps;Left                  PT Education - 11/29/19 1550    Education Details PT POC, Exam findings, HEP    Person(s) Educated Patient    Methods Explanation;Demonstration;Tactile cues;Verbal cues;Handout    Comprehension Verbalized understanding;Returned demonstration;Verbal cues required;Need further instruction;Tactile cues required            PT Short Term Goals - 11/29/19 1554      PT SHORT TERM GOAL #1   Title independent with initial HEP    Time 2    Period Weeks    Status New    Target Date 12/13/19             PT Long Term Goals - 11/29/19 1554      PT LONG TERM GOAL  #1   Title independent with final  HEP    Time 6    Period Weeks    Status New    Target Date 01/10/20      PT LONG TERM GOAL #2   Title Pt to demo improved strength of L hip to be at least 4+/5 for all motions, to improve stability and pain    Time 6    Period Weeks    Status New    Target Date 01/10/20      PT LONG TERM GOAL #3   Title Pt to demo improved gait pattern, to be [facility] for pt age, with equal stance time and cadence on L and R, without limp.    Time 6    Period Weeks    Status New    Target Date 01/10/20      PT LONG TERM GOAL #4   Title Pt to demo ability for walking/exercise up to 2 mi, without pain    Time 6    Period Weeks    Status New    Target Date 01/10/20      PT LONG TERM GOAL #5   Title Pt to report pain in L hip to be 0-2/10 with activity    Time 6    Period Weeks    Status New    Target Date 01/10/20                  Plan - 11/30/19 0919    Clinical Impression Statement Pt presents with primary complaint of increased pain in L hip, as well as chronic back pain. She states chronic gait asymmetry, and has decreased  advancement of L hip with gait.  She has mild weakness with strength testing, but is more apparent with standing and walking deficits. Pt with soreness in anterior and lateral hip with palpation today, but pain has improved quite a bit since MD visit. Pt with lack of effective HEP for dx, and will benefit from eduction on this. Pt to benefit from skilled PT to improve  deficits, gait, and pain.    Personal Factors and Comorbidities Time since onset of injury/illness/exacerbation    Examination-Activity Limitations Locomotion Level;Stairs;Stand    Examination-Participation Restrictions Community Activity;Shop;Cleaning;Occupation    Stability/Clinical Decision Making Evolving/Moderate complexity    Clinical Decision Making Moderate    Rehab Potential Good    PT Frequency 2x / week    PT Duration 6 weeks    PT  Treatment/Interventions ADLs/Self Care Home Management;Cryotherapy;Electrical Stimulation;Gait training;DME Instruction;Ultrasound;Traction;Moist Heat;Iontophoresis 4mg /ml Dexamethasone;Stair training;Functional mobility training;Therapeutic activities;Therapeutic exercise;Balance training;Neuromuscular re-education;Manual techniques;Patient/family education;Passive range of motion;Dry needling;Vasopneumatic Device;Spinal Manipulations;Joint Manipulations;Taping    Consulted and Agree with Plan of Care Patient           Patient will benefit from skilled therapeutic intervention in order to improve the following deficits and impairments:  Abnormal gait, Difficulty walking, Increased muscle spasms, Decreased activity tolerance, Pain, Improper body mechanics, Decreased balance, Decreased mobility, Decreased strength  Visit Diagnosis: Pain in left hip  Chronic bilateral low back pain, unspecified whether sciatica present  Muscle weakness (generalized)     Problem List Patient Active Problem List   Diagnosis Date Noted  . Left leg weakness 09/21/2019  . Hyperlipidemia   . Transaminitis   . Vitamin D deficiency     11/21/2019, PT, DPT 9:28 AM  11/30/19    [facility] [address] [place], [place], [place] Phone: [phone]   Fax:  [phone]  Name: [name] [name] MRN: Merlene Pulling Date of Birth: 03-05-1955

## 2019-12-07 ENCOUNTER — Encounter: Payer: Self-pay | Admitting: Family Medicine

## 2019-12-07 ENCOUNTER — Other Ambulatory Visit: Payer: Self-pay

## 2019-12-07 ENCOUNTER — Ambulatory Visit: Payer: No Typology Code available for payment source | Admitting: Family Medicine

## 2019-12-07 VITALS — BP 124/78 | HR 58 | Temp 97.9°F | Ht 61.0 in | Wt 149.8 lb

## 2019-12-07 DIAGNOSIS — Z23 Encounter for immunization: Secondary | ICD-10-CM

## 2019-12-07 DIAGNOSIS — Z0000 Encounter for general adult medical examination without abnormal findings: Secondary | ICD-10-CM | POA: Diagnosis not present

## 2019-12-07 NOTE — Progress Notes (Signed)
Subjective:    Patient ID: [name] [name], female    DOB: Jun 06, 1955, 64 y.o.   MRN: [id number]  HPI Here for a well exam. Her only complaint is stiffness and pain in all her fingers. She has been told she has arthritis. Oral Diclofenac helps a little, but some days she is limited by what she can do. Her last physical was in 2019 at [facility]. She had a colonoscopy in 2009 which was clear. She then had another one in 2020 where a single polyp was removed. She does not remember when she is supposed to have another one. She gets mammograms yearly. She recently saw Dr. Clementeen Graham for her leg weakness and she wlil be starting some PT soon.    Review of Systems  Constitutional: Negative.   HENT: Negative.   Eyes: Negative.   Respiratory: Negative.   Cardiovascular: Negative.   Gastrointestinal: Negative.   Genitourinary: Negative for decreased urine volume, difficulty urinating, dyspareunia, dysuria, enuresis, flank pain, frequency, hematuria, pelvic pain and urgency.  Musculoskeletal: Positive for arthralgias.  Skin: Negative.   Neurological: Negative.   Psychiatric/Behavioral: Negative.        Objective:   Physical Exam Constitutional:      General: She is not in acute distress.    Appearance: She is well-developed.  HENT:     Head: Normocephalic and atraumatic.     Right Ear: External ear normal.     Left Ear: External ear normal.     Nose: Nose normal.     Mouth/Throat:     Pharynx: No oropharyngeal exudate.  Eyes:     General: No scleral icterus.    Conjunctiva/sclera: Conjunctivae normal.     Pupils: Pupils are equal, round, and reactive to light.  Neck:     Thyroid: No thyromegaly.     Vascular: No JVD.  Cardiovascular:     Rate and Rhythm: Normal rate and regular rhythm.     Heart sounds: Normal heart sounds. No murmur heard.  No friction rub. No gallop.   Pulmonary:     Effort: Pulmonary effort is normal. No respiratory distress.     Breath sounds: Normal  breath sounds. No wheezing or rales.  Chest:     Chest wall: No tenderness.  Abdominal:     General: Bowel sounds are normal. There is no distension.     Palpations: Abdomen is soft. There is no mass.     Tenderness: There is no abdominal tenderness. There is no guarding or rebound.  Musculoskeletal:        General: Normal range of motion.     Cervical back: Normal range of motion and neck supple.     Comments: She has swelling and tenderness in most of the DIPs and PIPs on both hands   Lymphadenopathy:     Cervical: No cervical adenopathy.  Skin:    General: Skin is warm and dry.     Findings: No erythema or rash.  Neurological:     Mental Status: She is alert and oriented to person, place, and time.     Cranial Nerves: No cranial nerve deficit.     Motor: No abnormal muscle tone.     Coordination: Coordination normal.     Deep Tendon Reflexes: Reflexes are normal and symmetric. Reflexes normal.  Psychiatric:        Behavior: Behavior normal.        Thought Content: Thought content normal.  Judgment: Judgment normal.           Assessment & Plan:  Well exam. We discussed diet and exercise. I asked her to give [place] permission to get her records from [facility] on her colonoscopies. Get fasting labs today. 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 Given a pneumonia vaccine today.  Gershon Crane, MD

## 2019-12-08 ENCOUNTER — Encounter: Payer: Self-pay | Admitting: Physical Therapy

## 2019-12-08 ENCOUNTER — Ambulatory Visit (INDEPENDENT_AMBULATORY_CARE_PROVIDER_SITE_OTHER): Payer: No Typology Code available for payment source | Admitting: Physical Therapy

## 2019-12-08 DIAGNOSIS — M25552 Pain in left hip: Secondary | ICD-10-CM

## 2019-12-08 DIAGNOSIS — M6281 Muscle weakness (generalized): Secondary | ICD-10-CM

## 2019-12-08 DIAGNOSIS — G8929 Other chronic pain: Secondary | ICD-10-CM

## 2019-12-08 DIAGNOSIS — M545 Low back pain, unspecified: Secondary | ICD-10-CM | POA: Diagnosis not present

## 2019-12-08 LAB — LIPID PANEL
Cholesterol: 294 mg/dL — ABNORMAL HIGH (ref <200)
HDL: 59 mg/dL (ref >=50)
LDL Cholesterol (Calc): 195 mg/dL (calc) — ABNORMAL HIGH
Non-HDL Cholesterol (Calc): 235 mg/dL (calc) — ABNORMAL HIGH (ref <130)
Total CHOL/HDL Ratio: 5 (calc) — ABNORMAL HIGH (ref <5.0)
Triglycerides: 211 mg/dL — ABNORMAL HIGH (ref <150)

## 2019-12-08 LAB — BASIC METABOLIC PANEL
BUN: 13 mg/dL (ref 7–25)
CO2: 31 mmol/L (ref 20–32)
Calcium: 9.9 mg/dL (ref 8.6–10.4)
Chloride: 102 mmol/L (ref 98–110)
Creat: 0.57 mg/dL (ref 0.50–0.99)
Glucose, Bld: 88 mg/dL (ref 65–99)
Potassium: 3.7 mmol/L (ref 3.5–5.3)
Sodium: 141 mmol/L (ref 135–146)

## 2019-12-08 LAB — HEPATIC FUNCTION PANEL
AG Ratio: 1.5 (calc) (ref 1.0–2.5)
ALT: 22 U/L (ref 6–29)
AST: 23 U/L (ref 10–35)
Albumin: 4.4 g/dL (ref 3.6–5.1)
Alkaline phosphatase (APISO): 55 U/L (ref 37–153)
Bilirubin, Direct: 0.1 mg/dL (ref 0.0–0.2)
Globulin: 3 g/dL (calc) (ref 1.9–3.7)
Indirect Bilirubin: 0.8 mg/dL (calc) (ref 0.2–1.2)
Total Bilirubin: 0.9 mg/dL (ref 0.2–1.2)
Total Protein: 7.4 g/dL (ref 6.1–8.1)

## 2019-12-08 LAB — CBC WITH DIFFERENTIAL/PLATELET
Absolute Monocytes: 420 cells/uL (ref 200–950)
Basophils Absolute: 60 cells/uL (ref 0–200)
Basophils Relative: 1 %
Eosinophils Absolute: 90 cells/uL (ref 15–500)
Eosinophils Relative: 1.5 %
HCT: 42.4 % (ref 35.0–45.0)
Hemoglobin: 14.4 g/dL (ref 11.7–15.5)
Lymphs Abs: 3078 cells/uL (ref 850–3900)
MCH: 31.9 pg (ref 27.0–33.0)
MCHC: 34 g/dL (ref 32.0–36.0)
MCV: 93.8 fL (ref 80.0–100.0)
MPV: 9.8 fL (ref 7.5–12.5)
Monocytes Relative: 7 %
Neutro Abs: 2352 cells/uL (ref 1500–7800)
Neutrophils Relative %: 39.2 %
Platelets: 329 10*3/uL (ref 140–400)
RBC: 4.52 10*6/uL (ref 3.80–5.10)
RDW: 12.6 % (ref 11.0–15.0)
Total Lymphocyte: 51.3 %
WBC: 6 10*3/uL (ref 3.8–10.8)

## 2019-12-08 LAB — TSH: TSH: 3.07 mIU/L (ref 0.40–4.50)

## 2019-12-08 NOTE — Therapy (Addendum)
[place] [address] [place], [place], [address] Phone: [phone]   Fax:  [phone]  Physical Therapy Treatment  Patient Details  Name: [name] [name] MRN: [id number] Date of Birth: 11/05/1955 Referring Provider (PT): Lynne Leader   Encounter Date: 12/08/2019   PT End of Session - 12/08/19 1356    Visit Number 2    Number of Visits 12    Date for PT Re-Evaluation 01/10/20    Authorization Type Aetna    PT Start Time 1347    PT Stop Time 1430    PT Time Calculation (min) 43 min    Activity Tolerance Patient tolerated treatment well    Behavior During Therapy [facility] for tasks assessed/performed           Past Medical History:  Diagnosis Date  . Hyperlipidemia   . Transaminitis   . Vitamin D deficiency     Past Surgical History:  Procedure Laterality Date  . GALLBLADDER SURGERY    . TONSILLECTOMY AND ADENOIDECTOMY      There were no vitals filed for this visit.   Subjective Assessment - 12/08/19 1355    Subjective Pt states continued discomfort with hip.    Currently in Pain? Yes    Pain Score 2     Pain Location Hip    Pain Orientation Left    Pain Descriptors / Indicators Aching    Pain Type Acute pain    Pain Onset More than a month ago    Pain Frequency Intermittent                             [facility] Adult PT Treatment/Exercise - 12/08/19 1357      Ambulation/Gait   Gait Comments mild limp, L LE with decreased hip flexion, decreased cadence on L and quickness of step compared to R.       Exercises   Exercises Knee/Hip      Knee/Hip Exercises: Stretches   Other Knee/Hip Stretches SKTC 30 sec x3;     Other Knee/Hip Stretches Mod piriformis 30 sec x 2 bil;       Knee/Hip Exercises: Aerobic   Recumbent Bike L1 x 8 min;       Knee/Hip Exercises: Standing   Hip Abduction 20 reps;Both      Knee/Hip Exercises: Supine   Bridges 10 reps    Straight Leg Raises 10 reps;2 sets;Both     Other Supine Knee/Hip Exercises Supine march with GTB x 20;     Other Supine Knee/Hip Exercises Clams GTb x 20;       Knee/Hip Exercises: Sidelying   Hip ABduction 10 reps;Both      Manual Therapy   Manual Therapy Soft tissue mobilization;Joint mobilization    Joint Mobilization post and inf mobs gr 3, Long leg distractionfor L hip     Soft tissue mobilization STM/roller to L latera hip and ITB                     PT Short Term Goals - 11/29/19 1554      PT SHORT TERM GOAL #1   Title independent with initial HEP    Time 2    Period Weeks    Status New    Target Date 12/13/19             PT Long Term Goals - 11/29/19 1554      PT  LONG TERM GOAL #1   Title independent with final  HEP    Time 6    Period Weeks    Status New    Target Date 01/10/20      PT LONG TERM GOAL #2   Title Pt to demo improved strength of L hip to be at least 4+/5 for all motions, to improve stability and pain    Time 6    Period Weeks    Status New    Target Date 01/10/20      PT LONG TERM GOAL #3   Title Pt to demo improved gait pattern, to be [facility] for pt age, with equal stance time and cadence on L and R, without limp.    Time 6    Period Weeks    Status New    Target Date 01/10/20      PT LONG TERM GOAL #4   Title Pt to demo ability for walking/exercise up to 2 mi, without pain    Time 6    Period Weeks    Status New    Target Date 01/10/20      PT LONG TERM GOAL #5   Title Pt to report pain in L hip to be 0-2/10 with activity    Time 6    Period Weeks    Status New    Target Date 01/10/20                 Plan - 12/08/19 1612    Clinical Impression Statement Pt with soreness in L gr trochanter and lateral hip today with palpation and STM. Ther ex progressed for strengthening, HEP updated. May benefit from DN for glute in future. Pt going on vacation next week, will return after that.    Personal Factors and Comorbidities Time since onset of  injury/illness/exacerbation    Examination-Activity Limitations Locomotion Level;Stairs;Stand    Examination-Participation Restrictions Community Activity;Shop;Cleaning;Occupation    Stability/Clinical Decision Making Evolving/Moderate complexity    Rehab Potential Good    PT Frequency 2x / week    PT Duration 6 weeks    PT Treatment/Interventions ADLs/Self Care Home Management;Cryotherapy;Electrical Stimulation;Gait training;DME Instruction;Ultrasound;Traction;Moist Heat;Iontophoresis 51m/ml Dexamethasone;Stair training;Functional mobility training;Therapeutic activities;Therapeutic exercise;Balance training;Neuromuscular re-education;Manual techniques;Patient/family education;Passive range of motion;Dry needling;Vasopneumatic Device;Spinal Manipulations;Joint Manipulations;Taping    Consulted and Agree with Plan of Care Patient           Patient will benefit from skilled therapeutic intervention in order to improve the following deficits and impairments:  Abnormal gait, Difficulty walking, Increased muscle spasms, Decreased activity tolerance, Pain, Improper body mechanics, Decreased balance, Decreased mobility, Decreased strength  Visit Diagnosis: Pain in left hip  Chronic bilateral low back pain, unspecified whether sciatica present  Muscle weakness (generalized)     Problem List Patient Active Problem List   Diagnosis Date Noted  . Left leg weakness 09/21/2019  . Hyperlipidemia   . Transaminitis   . Vitamin D deficiency     LLyndee Hensen PT, DPT 4:16 PM  12/08/19    [place] [address]Phone: [phone]  Fax:  [phone] Name: [name] SEIDEMRN: [id number] of Birth: 105-May-1957  PHYSICAL THERAPY DISCHARGE SUMMARY  Visits from Start of Care: 2 Plan: Patient agrees to discharge.  Patient goals were partially met. Patient is being discharged due to not returning since the  last visit.  ?????     LLyndee Hensen PT, DPT 1:31 PM  06/06/20

## 2019-12-22 ENCOUNTER — Encounter: Payer: No Typology Code available for payment source | Admitting: Physical Therapy

## 2019-12-23 ENCOUNTER — Encounter: Payer: No Typology Code available for payment source | Admitting: Physical Therapy

## 2019-12-27 ENCOUNTER — Encounter: Payer: No Typology Code available for payment source | Admitting: Physical Therapy

## 2019-12-29 ENCOUNTER — Encounter: Payer: No Typology Code available for payment source | Admitting: Physical Therapy

## 2019-12-31 ENCOUNTER — Other Ambulatory Visit: Payer: Self-pay

## 2019-12-31 ENCOUNTER — Encounter: Payer: Self-pay | Admitting: Family Medicine

## 2019-12-31 ENCOUNTER — Ambulatory Visit: Payer: No Typology Code available for payment source | Admitting: Family Medicine

## 2019-12-31 VITALS — BP 122/70 | HR 65 | Temp 98.2°F | Ht 61.0 in | Wt 151.0 lb

## 2019-12-31 DIAGNOSIS — R300 Dysuria: Secondary | ICD-10-CM

## 2019-12-31 DIAGNOSIS — N3000 Acute cystitis without hematuria: Secondary | ICD-10-CM

## 2019-12-31 LAB — POC URINALSYSI DIPSTICK (AUTOMATED)
Glucose, UA: POSITIVE — AB
Ketones, UA: POSITIVE
Nitrite, UA: POSITIVE
Protein, UA: POSITIVE — AB
Spec Grav, UA: >=1.03 — AB (ref 1.010–1.025)
Urobilinogen, UA: >=8 E.U./dL — AB
pH, UA: 5 (ref 5.0–8.0)

## 2019-12-31 MED ORDER — SULFAMETHOXAZOLE-TRIMETHOPRIM 800-160 MG PO TABS: 1.0000 | ORAL_TABLET | Freq: Two times a day (BID) | ORAL | 0 refills | Status: DC

## 2019-12-31 NOTE — Patient Instructions (Signed)
Infeccin urinaria en los adultos Urinary Tract Infection, Adult  Una infeccin urinaria (IU) puede ocurrir en cualquier lugar de las vas urinarias. Las vas urinarias incluyen a los riones, los urteres, la vejiga y la uretra. Estos rganos fabrican, almacenan y eliminan la orina del organismo. El mdico puede usar otras palabras para describir la infeccin. La IU alta afecta los urteres y a los riones (pielonefritis). La IU baja afecta la vejiga (cistitis) y la uretra (uretritis). Cules son las causas? La mayora de las infecciones de las vas urinarias es causada por la presencia de bacterias en la zona genital, alrededor de la entrada de las vas urinarias (uretra). Estas bacterias proliferan y causan inflamacin en las vas urinarias. Qu incrementa el riesgo? Es ms probable que contraiga esta afeccin si:  Tiene colocado un catter urinario (sonda urinaria) permanente.  No puede controlar cundo orinar o defecar (tiene incontinencia).  Es mujer y usted: ? Utiliza espermicida o diafragma como mtodo anticonceptivo. ? Tiene niveles bajos de estrgenos. ? Estn embarazadas.  Tiene ciertos genes que aumentan su riesgo (gentica).  Es sexualmente activa.  Toma antibiticos.  Tiene una afeccin que causa que el flujo de orina sea lento, como: ? Prstata agrandada, si usted es hombre. ? Obstruccin de la uretra (estenosis). ? Clculo renal. ? Una afeccin nerviosa que afecta el control de la vejiga (vejiga neurgena). ? No bebe lo suficiente o no orina con frecuencia.  Tiene ciertas enfermedades crnicas, como: ? Diabetes. ? Un sistema que combate las enfermedades (sistemainmunitario) debilitado. ? Anemia drepanoctica. ? Gota. ? Lesin en la mdula espinal. Cules son los signos o los sntomas? Los sntomas de esta afeccin incluyen:  Necesidad inmediata (urgente) de orinar.  Miccin frecuente o eliminacin de pequeas cantidades de orina con frecuencia.  Ardor o  dolor al orinar.  Presencia de sangre en la orina.  Orina con mal olor u olor atpico.  Dificultad para orinar.  Orina turbia.  Secrecin vaginal, si es mujer.  Dolor en el abdomen o en la parte inferior de la espalda. Es posible que tambin tenga:  Vmitos o disminucin del apetito.  Confusin.  Irritabilidad o cansancio.  Fiebre.  Diarrea. El primer sntoma en los adultos mayores puede ser la confusin. En algunos casos, es posible que no tengan sntomas hasta que la infeccin empeore. Cmo se diagnostica? Esta afeccin se diagnostica en funcin de sus antecedentes mdicos y de un examen fsico. Tambin pueden hacerle otras pruebas, incluidas las siguientes:  Anlisis de orina.  Anlisis de sangre.  Pruebas de infecciones de transmisin sexual (ITS). Si ha tenido ms de una infeccin urinaria (IU), se pueden hacer estudios de diagnstico por imgenes o una cistoscopia para determinar la causa de las infecciones. Cmo se trata? El tratamiento de esta afeccin incluye lo siguiente:  Antibiticos.  Medicamentos de venta libre para aliviar las molestias.  Beber una cantidad suficiente agua para mantenerse hidratado. Si tiene infecciones con frecuencia o tiene otras afecciones, como un clculo renal, es posible que deba ver a un mdico especialista en las vas urinarias (urlogo). En casos poco frecuentes, las infecciones urinarias pueden provocar sepsis. La sepsis es una afeccin potencialmente mortal que se produce cuando el cuerpo responde a una infeccin. La sepsis se trata en el hospital con antibiticos, lquidos y otros medicamentos que se administran por va intravenosa. Sigue estas instrucciones en tu casa:  Medicamentos  Toma los medicamentos de venta libre y los recetados solamente como se lo haya indicado el mdico.  Si le recetaron   un antibitico, tmelo como se lo haya indicado el mdico. No deje de usar el antibitico aunque comience a sentirse  mejor. Indicaciones generales  Asegrese de hacer lo siguiente: ? Vaciar la vejiga con frecuencia y en su totalidad. No contener la orina durante largos perodos. ? Vaciar la vejiga despus de tener sexo. ? Limpiarse de adelante hacia atrs despus de defecar, si es mujer. Use cada trozo de papel higinico solo una vez cuando se limpie.  Beba suficiente lquido como para mantener la orina de color amarillo plido.  Concurrir a todas las visitas de seguimiento como se lo haya indicado el mdico. Esto es importante. Comuncate con un mdico si:  Los sntomas no mejoran despus de 1 o 2das de tratamiento.  Los sntomas desaparecen y luego vuelven a aparecer. Solicite ayuda inmediatamente si tiene:  Dolor intenso en la espalda o en la parte inferior del abdomen.  Fiebre.  Nuseas o vmitos. Resumen  Una infeccin urinaria (IU) es una infeccin en cualquier parte de las vas urinarias, que incluyen los riones, los urteres, la vejiga y la uretra.  La mayora de las infecciones de las vas urinarias es causada por la presencia de bacterias en la zona genital, alrededor de la entrada de las vas urinarias (uretra).  El tratamiento de esta afeccin suele incluir antibiticos.  Si le recetaron un antibitico, tmelo como se lo haya indicado el mdico. No deje de usar el antibitico aunque comience a sentirse mejor.  Concurrir a todas las visitas de seguimiento como se lo haya indicado el mdico. Esto es importante. Esta informacin no tiene como fin reemplazar el consejo del mdico. Asegrese de hacerle al mdico cualquier pregunta que tenga. Document Revised: 01/21/2018 Document Reviewed: 01/21/2018 Elsevier Patient Education  2020 [employer]  

## 2019-12-31 NOTE — Progress Notes (Signed)
  [name] [name] DOB: Jun 18, 1955 Encounter date: 12/31/2019  This is a 64 y.o. female who presents with Chief Complaint  Patient presents with  . Dysuria    burning with urination x4 days, stared AZO yesterday    History of present illness: No blood in urine. Just pain with urination. No abd pain. Azo helps a little. Not frequent. NO fevers. No back pain.     Allergies  Allergen Reactions  . Codeine    Current Meds  Medication Sig  . atorvastatin (LIPITOR) 20 MG tablet Take 1 tablet (20 mg total) by mouth daily.  . AZO-CRANBERRY PO Take by mouth as needed.   . [DISCONTINUED] diclofenac (VOLTAREN) 75 MG EC tablet TAKE 1 TABLET BY MOUTH TWICE A DAY    Review of Systems  Constitutional: Negative for chills, fatigue and fever.  Respiratory: Negative for cough, chest tightness, shortness of breath and wheezing.   Cardiovascular: Negative for chest pain, palpitations and leg swelling.  Genitourinary: Positive for dysuria. Negative for difficulty urinating, flank pain and hematuria.    Objective:  BP 122/70 (BP Location: Left Arm, Patient Position: Sitting, Cuff Size: Normal)   Pulse 65   Temp 98.2 F (36.8 C) (Oral)   Ht 5\' 1"  (1.549 m)   Wt 151 lb (68.5 kg)   BMI 28.53 kg/m   Weight: 151 lb (68.5 kg)   BP Readings from Last 3 Encounters:  12/31/19 122/70  12/07/19 124/78  10/27/19 110/72   Wt Readings from Last 3 Encounters:  12/31/19 151 lb (68.5 kg)  12/07/19 149 lb 12.8 oz (67.9 kg)  10/27/19 151 lb (68.5 kg)    Physical Exam Constitutional:      General: She is not in acute distress.    Appearance: She is well-developed.  Cardiovascular:     Rate and Rhythm: Normal rate and regular rhythm.     Heart sounds: Normal heart sounds. No murmur heard.  No friction rub.  Pulmonary:     Effort: Pulmonary effort is normal. No respiratory distress.     Breath sounds: Normal breath sounds. No wheezing or rales.  Abdominal:     Tenderness: There is no abdominal  tenderness. There is no right CVA tenderness or left CVA tenderness.  Musculoskeletal:     Right lower leg: No edema.     Left lower leg: No edema.  Neurological:     Mental Status: She is alert and oriented to person, place, and time.  Psychiatric:        Behavior: Behavior normal.     Assessment/Plan  1. Dysuria - POCT Urinalysis Dipstick (Automated) - Urine Culture; Future - Urine Culture  2. Acute cystitis without hematuria Will send urine for culture; follow up pending results. Let 10/29/19 know if any worsening of symptoms in the meanwhile. - Urine Culture; Future - Urine Culture    Return if symptoms worsen or fail to improve.     [place], MD

## 2020-01-01 LAB — URINE CULTURE
MICRO NUMBER:: 11226811
Result:: NO GROWTH
SPECIMEN QUALITY:: ADEQUATE

## 2020-01-03 ENCOUNTER — Encounter: Payer: No Typology Code available for payment source | Admitting: Physical Therapy

## 2020-01-05 ENCOUNTER — Encounter: Payer: No Typology Code available for payment source | Admitting: Physical Therapy

## 2020-01-10 ENCOUNTER — Encounter: Payer: No Typology Code available for payment source | Admitting: Physical Therapy

## 2020-01-12 ENCOUNTER — Encounter: Payer: No Typology Code available for payment source | Admitting: Physical Therapy

## 2020-08-04 ENCOUNTER — Other Ambulatory Visit: Payer: Self-pay

## 2020-08-04 ENCOUNTER — Encounter: Payer: Self-pay | Admitting: Family Medicine

## 2020-08-04 ENCOUNTER — Ambulatory Visit: Payer: No Typology Code available for payment source | Admitting: Family Medicine

## 2020-08-04 VITALS — BP 102/60 | HR 66 | Temp 98.6°F | Wt 146.0 lb

## 2020-08-04 DIAGNOSIS — S8002XA Contusion of left knee, initial encounter: Secondary | ICD-10-CM | POA: Diagnosis not present

## 2020-08-04 NOTE — Progress Notes (Signed)
   Subjective:    Patient ID: [name] [name], female    DOB: 06-24-1955, 65 y.o.   MRN: [id number]  HPI Here for an injury to the left knee that occured last night at home. As she was walking her left leg became twisted, causing her to fall forward. She landed on the left knee. She has had pain and swelling in the anterior knee since then, although it feels much better this afternoon than it did this morning. She has been icing it, but she has not taken any medication for it.    Review of Systems  Constitutional: Negative.   Respiratory: Negative.    Cardiovascular: Negative.   Musculoskeletal:  Positive for arthralgias.      Objective:   Physical Exam Constitutional:      Comments: Walks with a slight limp   Cardiovascular:     Rate and Rhythm: Normal rate and regular rhythm.     Pulses: Normal pulses.     Heart sounds: Normal heart sounds.  Pulmonary:     Effort: Pulmonary effort is normal.     Breath sounds: Normal breath sounds.  Musculoskeletal:     Comments: The left knee is mildly swollen and it is tender over the patella. ROM is full although she has pain on full flexion. No joint space tenderness. Negative McMurrays, negative anterior drawer.   Neurological:     Mental Status: She is alert.          Assessment & Plan:  Knee contusion, causing some patellar bursa swelling. She will stay off the leg this weekend and continue to ice the knee. Take Diclofenac BID as needed. Recheck as needed.  Gershon Crane, MD

## 2020-12-07 ENCOUNTER — Telehealth: Payer: Self-pay | Admitting: Family Medicine

## 2020-12-07 DIAGNOSIS — E785 Hyperlipidemia, unspecified: Secondary | ICD-10-CM

## 2020-12-07 DIAGNOSIS — R739 Hyperglycemia, unspecified: Secondary | ICD-10-CM

## 2020-12-07 DIAGNOSIS — E559 Vitamin D deficiency, unspecified: Secondary | ICD-10-CM

## 2020-12-07 NOTE — Telephone Encounter (Signed)
Patient wants to get lab work done before physical on 10/31. Patient is unable to fast until appointment at 2:15 on same day.     Good callback number is [phone]     Please Advise

## 2020-12-07 NOTE — Telephone Encounter (Signed)
Please advise 

## 2020-12-08 NOTE — Telephone Encounter (Signed)
The orders were placed  ?

## 2020-12-08 NOTE — Telephone Encounter (Signed)
Spoke with patient lab appointment scheduled for 12/11/20 at 7:40 am.

## 2020-12-11 ENCOUNTER — Encounter: Payer: Managed Care, Other (non HMO) | Admitting: Family Medicine

## 2020-12-11 ENCOUNTER — Other Ambulatory Visit: Payer: Managed Care, Other (non HMO)

## 2020-12-20 ENCOUNTER — Other Ambulatory Visit (INDEPENDENT_AMBULATORY_CARE_PROVIDER_SITE_OTHER): Payer: Managed Care, Other (non HMO)

## 2020-12-20 ENCOUNTER — Encounter: Payer: Self-pay | Admitting: Family Medicine

## 2020-12-20 ENCOUNTER — Telehealth: Payer: Self-pay | Admitting: Family Medicine

## 2020-12-20 ENCOUNTER — Other Ambulatory Visit: Payer: Self-pay

## 2020-12-20 ENCOUNTER — Ambulatory Visit (INDEPENDENT_AMBULATORY_CARE_PROVIDER_SITE_OTHER): Payer: Managed Care, Other (non HMO) | Admitting: Family Medicine

## 2020-12-20 VITALS — BP 120/76 | HR 74 | Temp 98.6°F | Ht 60.0 in | Wt 151.0 lb

## 2020-12-20 DIAGNOSIS — E559 Vitamin D deficiency, unspecified: Secondary | ICD-10-CM

## 2020-12-20 DIAGNOSIS — M8580 Other specified disorders of bone density and structure, unspecified site: Secondary | ICD-10-CM

## 2020-12-20 DIAGNOSIS — R7989 Other specified abnormal findings of blood chemistry: Secondary | ICD-10-CM

## 2020-12-20 DIAGNOSIS — R739 Hyperglycemia, unspecified: Secondary | ICD-10-CM

## 2020-12-20 DIAGNOSIS — Z0000 Encounter for general adult medical examination without abnormal findings: Secondary | ICD-10-CM | POA: Diagnosis not present

## 2020-12-20 DIAGNOSIS — E785 Hyperlipidemia, unspecified: Secondary | ICD-10-CM

## 2020-12-20 LAB — BASIC METABOLIC PANEL
BUN: 14 mg/dL (ref 6–23)
CO2: 28 mEq/L (ref 19–32)
Calcium: 9.4 mg/dL (ref 8.4–10.5)
Chloride: 104 mEq/L (ref 96–112)
Creatinine, Ser: 0.58 mg/dL (ref 0.40–1.20)
GFR: 93.97 mL/min (ref >60.00)
Glucose, Bld: 106 mg/dL — ABNORMAL HIGH (ref 70–99)
Potassium: 3.5 mEq/L (ref 3.5–5.1)
Sodium: 141 mEq/L (ref 135–145)

## 2020-12-20 LAB — CBC WITH DIFFERENTIAL/PLATELET
Basophils Absolute: 0 10*3/uL (ref 0.0–0.1)
Basophils Relative: 0.6 % (ref 0.0–3.0)
Eosinophils Absolute: 0.2 10*3/uL (ref 0.0–0.7)
Eosinophils Relative: 2.9 % (ref 0.0–5.0)
HCT: 40.3 % (ref 36.0–46.0)
Hemoglobin: 13.8 g/dL (ref 12.0–15.0)
Lymphocytes Relative: 54 % — ABNORMAL HIGH (ref 12.0–46.0)
Lymphs Abs: 3 10*3/uL (ref 0.7–4.0)
MCHC: 34.2 g/dL (ref 30.0–36.0)
MCV: 91.9 fl (ref 78.0–100.0)
Monocytes Absolute: 0.4 10*3/uL (ref 0.1–1.0)
Monocytes Relative: 7.1 % (ref 3.0–12.0)
Neutro Abs: 2 10*3/uL (ref 1.4–7.7)
Neutrophils Relative %: 35.4 % — ABNORMAL LOW (ref 43.0–77.0)
Platelets: 291 10*3/uL (ref 150.0–400.0)
RBC: 4.38 Mil/uL (ref 3.87–5.11)
RDW: 13.3 % (ref 11.5–15.5)
WBC: 5.6 10*3/uL (ref 4.0–10.5)

## 2020-12-20 LAB — VITAMIN D 25 HYDROXY (VIT D DEFICIENCY, FRACTURES): VITD: 16.66 ng/mL — ABNORMAL LOW (ref 30.00–100.00)

## 2020-12-20 LAB — LIPID PANEL
Cholesterol: 277 mg/dL — ABNORMAL HIGH (ref 0–200)
HDL: 48.8 mg/dL (ref >39.00)
NonHDL: 228.58
Total CHOL/HDL Ratio: 6
Triglycerides: 346 mg/dL — ABNORMAL HIGH (ref 0.0–149.0)
VLDL: 69.2 mg/dL — ABNORMAL HIGH (ref 0.0–40.0)

## 2020-12-20 LAB — HEPATIC FUNCTION PANEL
ALT: 37 U/L — ABNORMAL HIGH (ref 0–35)
AST: 31 U/L (ref 0–37)
Albumin: 4.1 g/dL (ref 3.5–5.2)
Alkaline Phosphatase: 52 U/L (ref 39–117)
Bilirubin, Direct: 0 mg/dL (ref 0.0–0.3)
Total Bilirubin: 0.6 mg/dL (ref 0.2–1.2)
Total Protein: 7.3 g/dL (ref 6.0–8.3)

## 2020-12-20 LAB — LDL CHOLESTEROL, DIRECT: Direct LDL: 150 mg/dL

## 2020-12-20 LAB — TSH: TSH: 6.52 u[IU]/mL — ABNORMAL HIGH (ref 0.35–5.50)

## 2020-12-20 LAB — T4, FREE: Free T4: 0.73 ng/dL (ref 0.60–1.60)

## 2020-12-20 LAB — T3, FREE: T3, Free: 2.9 pg/mL (ref 2.3–4.2)

## 2020-12-20 LAB — HEMOGLOBIN A1C: Hgb A1c MFr Bld: 6 % (ref 4.6–6.5)

## 2020-12-20 MED ORDER — ATORVASTATIN CALCIUM 40 MG PO TABS: 40.0000 mg | ORAL_TABLET | Freq: Every day | ORAL | 3 refills | Status: AC

## 2020-12-20 NOTE — Telephone Encounter (Signed)
The labs will be placed before pt next CPE appointment

## 2020-12-20 NOTE — Telephone Encounter (Signed)
Patient had labs done this morning and a cpe done this afternoon.  She wants to do that again next year.  She has been scheduled but needs those orders put in for her cpe labs for next year.

## 2020-12-21 ENCOUNTER — Encounter: Payer: Self-pay | Admitting: Family Medicine

## 2020-12-21 NOTE — Progress Notes (Signed)
   Subjective:    Patient ID: [name] [name], female    DOB: 1955/12/15, 65 y.o.   MRN: [id number]  HPI Here with a translator for a well exam. She feels fine and has no concerns.    Review of Systems  Constitutional: Negative.   HENT: Negative.    Eyes: Negative.   Respiratory: Negative.    Cardiovascular: Negative.   Gastrointestinal: Negative.   Genitourinary:  Negative for decreased urine volume, difficulty urinating, dyspareunia, dysuria, enuresis, flank pain, frequency, hematuria, pelvic pain and urgency.  Musculoskeletal: Negative.   Skin: Negative.   Neurological: Negative.  Negative for headaches.  Psychiatric/Behavioral: Negative.        Objective:   Physical Exam Constitutional:      General: She is not in acute distress.    Appearance: She is well-developed.  HENT:     Head: Normocephalic and atraumatic.     Right Ear: External ear normal.     Left Ear: External ear normal.     Nose: Nose normal.     Mouth/Throat:     Pharynx: No oropharyngeal exudate.  Eyes:     General: No scleral icterus.    Conjunctiva/sclera: Conjunctivae normal.     Pupils: Pupils are equal, round, and reactive to light.  Neck:     Thyroid: No thyromegaly.     Vascular: No JVD.  Cardiovascular:     Rate and Rhythm: Normal rate and regular rhythm.     Heart sounds: Normal heart sounds. No murmur heard.   No friction rub. No gallop.  Pulmonary:     Effort: Pulmonary effort is normal. No respiratory distress.     Breath sounds: Normal breath sounds. No wheezing or rales.  Chest:     Chest wall: No tenderness.  Abdominal:     General: Bowel sounds are normal. There is no distension.     Palpations: Abdomen is soft. There is no mass.     Tenderness: There is no abdominal tenderness. There is no guarding or rebound.  Musculoskeletal:        General: No tenderness. Normal range of motion.     Cervical back: Normal range of motion and neck supple.  Lymphadenopathy:     Cervical: No  cervical adenopathy.  Skin:    General: Skin is warm and dry.     Findings: No erythema or rash.  Neurological:     Mental Status: She is alert and oriented to person, place, and time.     Cranial Nerves: No cranial nerve deficit.     Motor: No abnormal muscle tone.     Coordination: Coordination normal.     Deep Tendon Reflexes: Reflexes are normal and symmetric. Reflexes normal.  Psychiatric:        Behavior: Behavior normal.        Thought Content: Thought content normal.        Judgment: Judgment normal.          Assessment & Plan:  Well exam. We discussed diet and exercise. We went over her  lab results from this morning, and these were remarkable for an LDL of 150. Therefore we will increase her Lipitor to 40 mg daily. We will order another DEXA. She will set up a mammogram. Her TSH was also elevated to 6.52, so we will check a free T3 and a free T4 here today.  Gershon Crane, MD

## 2020-12-22 ENCOUNTER — Other Ambulatory Visit: Payer: Self-pay

## 2020-12-22 DIAGNOSIS — R7989 Other specified abnormal findings of blood chemistry: Secondary | ICD-10-CM

## 2020-12-22 MED ORDER — LEVOTHYROXINE SODIUM 75 MCG PO TABS: 75.0000 ug | ORAL_TABLET | Freq: Every day | ORAL | 3 refills | Status: AC

## 2020-12-29 ENCOUNTER — Other Ambulatory Visit: Payer: Self-pay | Admitting: Family Medicine

## 2020-12-29 DIAGNOSIS — Z1231 Encounter for screening mammogram for malignant neoplasm of breast: Secondary | ICD-10-CM

## 2021-01-11 ENCOUNTER — Telehealth: Payer: Self-pay | Admitting: Family Medicine

## 2021-01-11 NOTE — Telephone Encounter (Signed)
Patient needs cpe lab orders for next year put in the system.  She wants to get her labs done before her appointment.

## 2021-01-12 NOTE — Telephone Encounter (Signed)
This is too early. We can put in the orders about a week before the CPX

## 2021-01-16 NOTE — Telephone Encounter (Signed)
Left detailed voicemail concerning message.

## 2021-01-26 ENCOUNTER — Telehealth: Payer: Self-pay | Admitting: Family Medicine

## 2021-01-26 NOTE — Telephone Encounter (Signed)
Pt needs lab orders for 12/22/21 for her physical labs.

## 2021-01-26 NOTE — Telephone Encounter (Signed)
Information was address on 01/11/21, telephone note.   Lvm for patient with information, My Chart message sent to patient with information.

## 2021-03-05 ENCOUNTER — Telehealth: Payer: Self-pay | Admitting: Family Medicine

## 2021-03-05 NOTE — Telephone Encounter (Addendum)
Message has previously been answered twice.   Please see 01/26/2021 telephone message.  Called patient lvm with previous message from MD.

## 2021-03-05 NOTE — Telephone Encounter (Signed)
Patient needs cpe lab orders placed for her appt in November--she stated Dr. Sarajane Jews wanted her to get labs before her physical so results would be in by the time of her physical.

## 2021-05-09 ENCOUNTER — Telehealth: Payer: Self-pay | Admitting: Family Medicine

## 2021-05-09 NOTE — Telephone Encounter (Signed)
Patient spouse called in requesting a referral for [facility] with Dr.Karen Karel Jarvis. ? ?[name] [name] would like a phone call back when this is complete. ? ?Please advise. ?

## 2021-05-11 NOTE — Telephone Encounter (Signed)
What would such a referral be for?  ?

## 2021-05-11 NOTE — Telephone Encounter (Signed)
Spoke with pt scheduled appointment with Dr Sarajane Jews on 05/17/2021 ?

## 2021-05-14 ENCOUNTER — Telehealth: Payer: Self-pay | Admitting: Family Medicine

## 2021-05-14 DIAGNOSIS — R29898 Other symptoms and signs involving the musculoskeletal system: Secondary | ICD-10-CM

## 2021-05-14 NOTE — Telephone Encounter (Signed)
Patient son called because they no longer want appointment as they do not feel it is necessary. They would like to just have referral sent in to see Dr.Karen Karel Jarvis because this would be a second opinion for the same issue they were previously seen at a different neurologist for. ? ? ? ? ?Her son Rene Paci would like a callback when referral is placed so he can get his mother scheduled  ? ? ? ? ?Please advise  ?

## 2021-05-15 NOTE — Telephone Encounter (Signed)
As I asked before, what is the reason for such a referral ? ?

## 2021-05-15 NOTE — Telephone Encounter (Signed)
Spoke with Ismael and he stated the patient's "left leg is lazy, there is no pain present, she has to place her arm by the left side to help keep up with the right leg to continue walking and she needs help or a "push" to walk.  Message sent to PCP. ?

## 2021-05-15 NOTE — Telephone Encounter (Signed)
Please advise 

## 2021-05-16 NOTE — Telephone Encounter (Signed)
I did the referral to Dr. Delice Lesch  ?

## 2021-05-17 ENCOUNTER — Ambulatory Visit: Payer: Managed Care, Other (non HMO) | Admitting: Family Medicine

## 2021-05-17 NOTE — Telephone Encounter (Signed)
Spoke with Rene Paci, informed him the referral was placed as below, a referral coordinator will work on Therapist, occupational and someone will contact the patient with appt info. ?

## 2021-05-23 ENCOUNTER — Ambulatory Visit
Admission: RE | Admit: 2021-05-23 | Discharge: 2021-05-23 | Disposition: A | Discharge status: Home / Self Care | Payer: Managed Care, Other (non HMO) | Source: Ambulatory Visit | Attending: Family Medicine | Admitting: Family Medicine

## 2021-05-23 DIAGNOSIS — M8580 Other specified disorders of bone density and structure, unspecified site: Secondary | ICD-10-CM

## 2021-05-23 DIAGNOSIS — Z1231 Encounter for screening mammogram for malignant neoplasm of breast: Secondary | ICD-10-CM

## 2021-05-24 ENCOUNTER — Telehealth: Payer: Self-pay | Admitting: Family Medicine

## 2021-05-28 ENCOUNTER — Telehealth: Payer: Self-pay | Admitting: Family Medicine

## 2021-05-28 NOTE — Telephone Encounter (Signed)
Patient called to get results from Bone Density and Mammogram... Patient request to have Dr Clent Ridges call her with results.   ?I informed patient that CMA would give a call and possibly Dr Clent Ridges no promise.. ? ?She is wanting a call, she urgently wants results. ?

## 2021-05-28 NOTE — Telephone Encounter (Signed)
Spoke with patient about bone density test results.     Awaiting result message for Mammogram completed on 05/23/2021. ?

## 2021-07-27 NOTE — Telephone Encounter (Signed)
error 

## 2021-10-12 ENCOUNTER — Ambulatory Visit: Payer: Managed Care, Other (non HMO) | Admitting: Family Medicine

## 2021-12-21 ENCOUNTER — Encounter: Payer: Managed Care, Other (non HMO) | Admitting: Family Medicine

## 2021-12-21 ENCOUNTER — Other Ambulatory Visit: Payer: Managed Care, Other (non HMO)

## 2022-07-20 IMAGING — MG MM DIGITAL SCREENING BILAT W/ TOMO AND CAD
8 series · 9 of 24 positions shown · non-contrast
Comparison: Previous exam(s).

CLINICAL DATA: Screening.

EXAM:
DIGITAL SCREENING BILATERAL MAMMOGRAM WITH TOMOSYNTHESIS AND CAD
TECHNIQUE: Bilateral screening digital craniocaudal and mediolateral oblique
mammograms were obtained. Bilateral screening digital breast
tomosynthesis was performed. The images were evaluated with
computer-aided detection.

[R CC synth-2D]
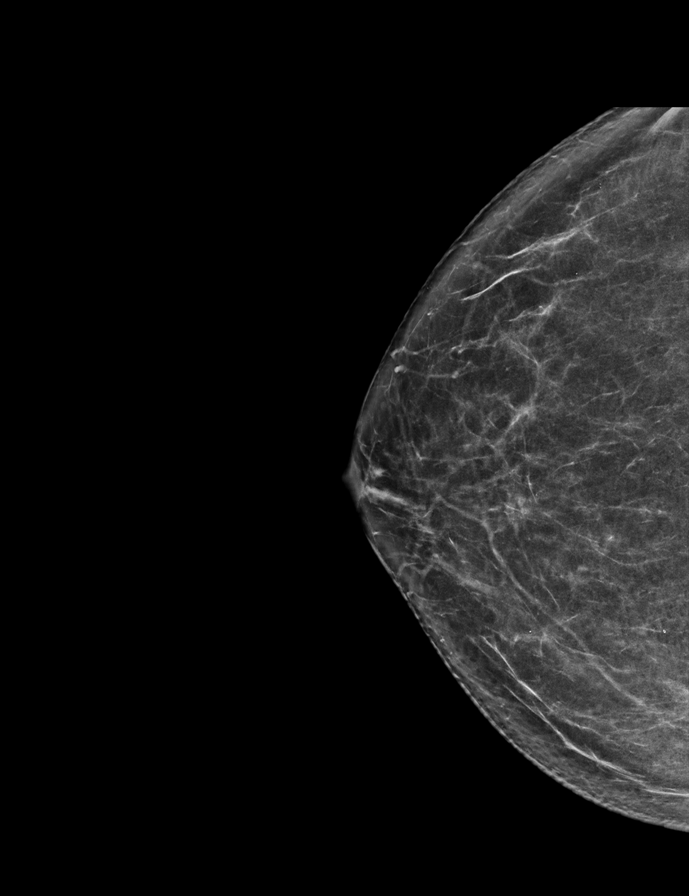

[R MLO synth-2D]
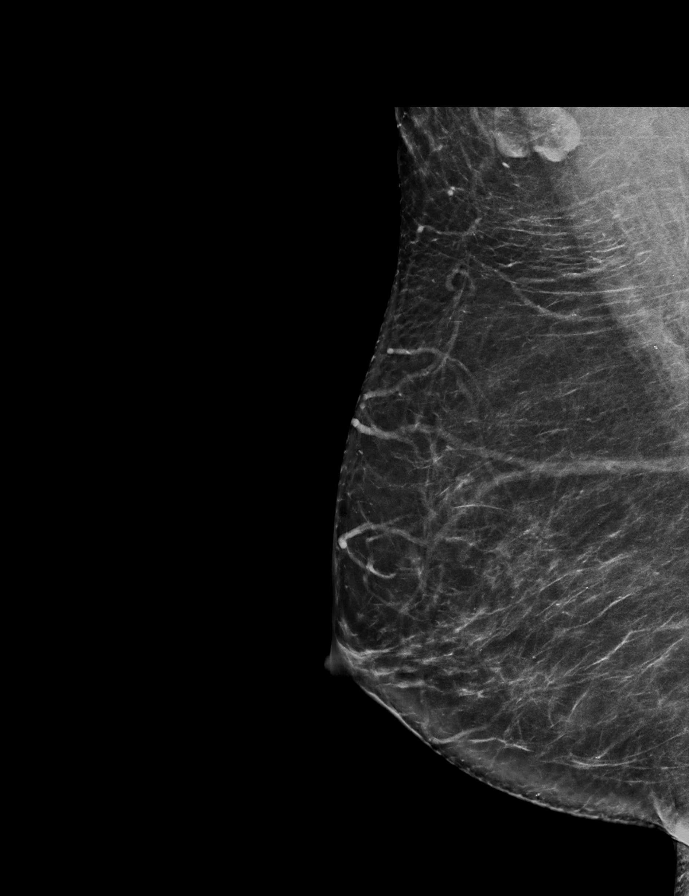

[L MLO synth-2D]
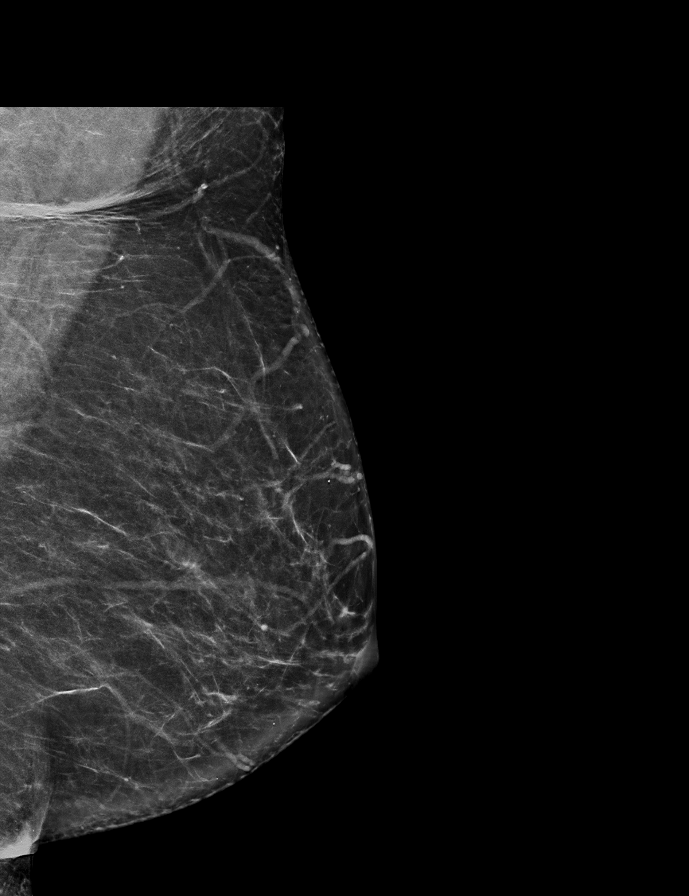

[L CC synth-2D]
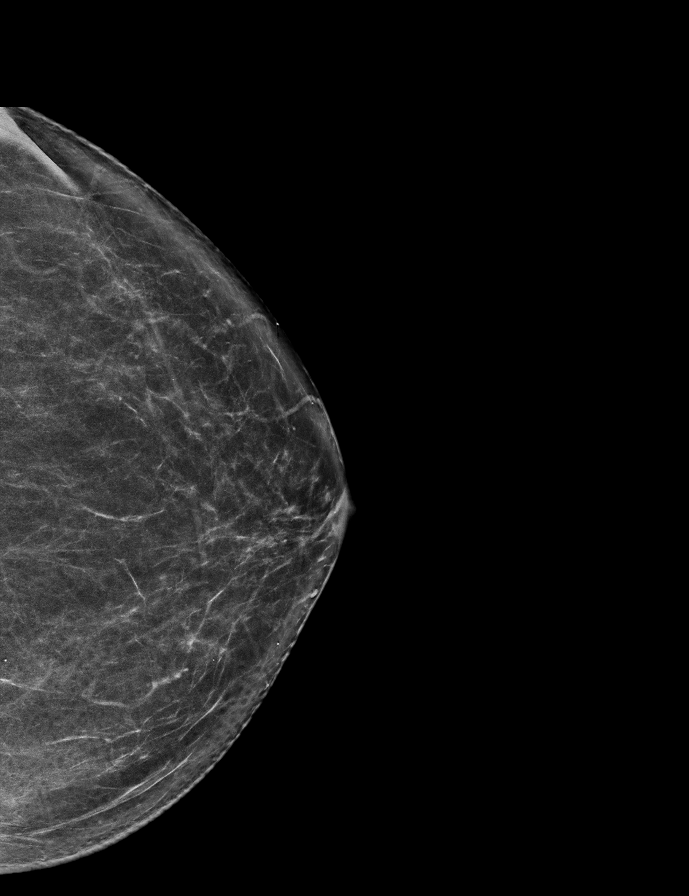

[L CC tomo · 2 of 70 frames shown]
[frame 23/70]
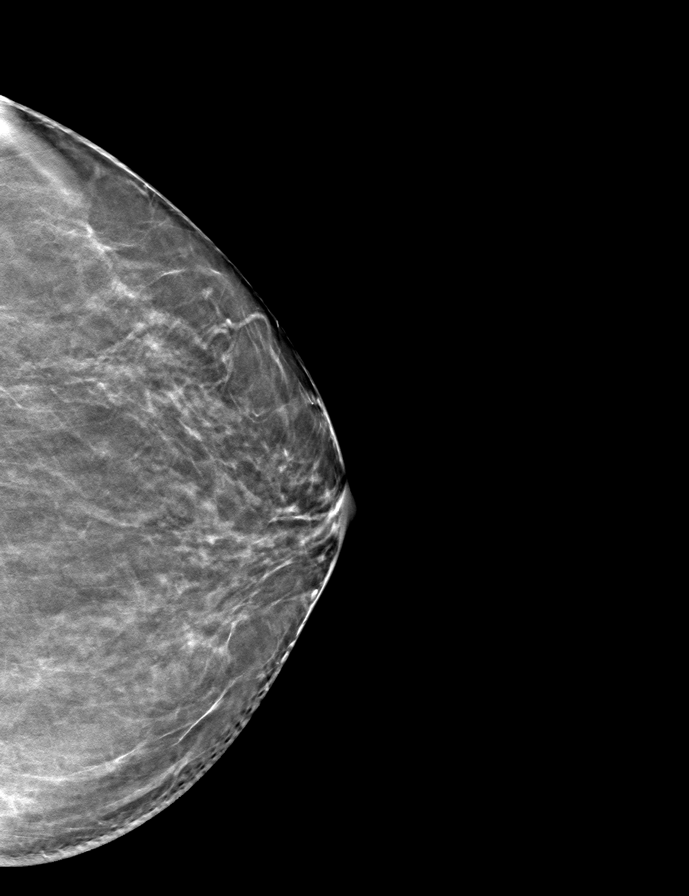
[frame 35/70]
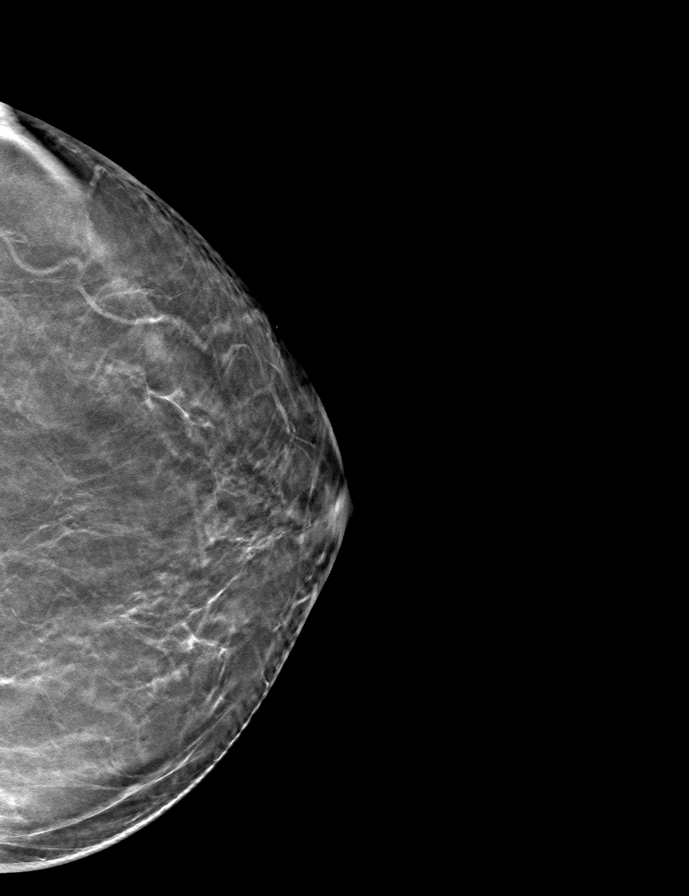

[R CC tomo · tomo slice 35/69.0]
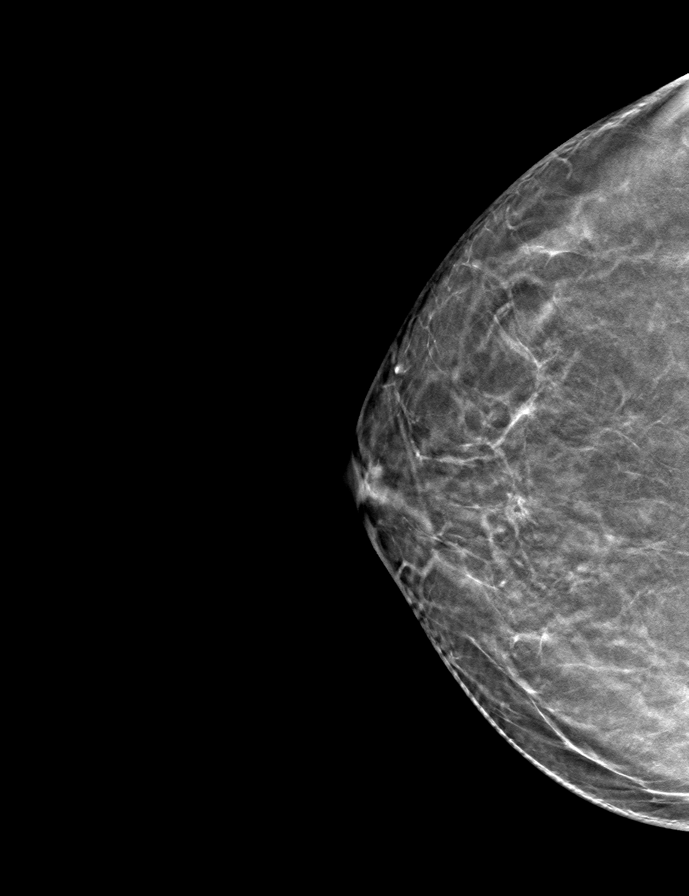

[L MLO tomo · tomo slice 37/74.0]
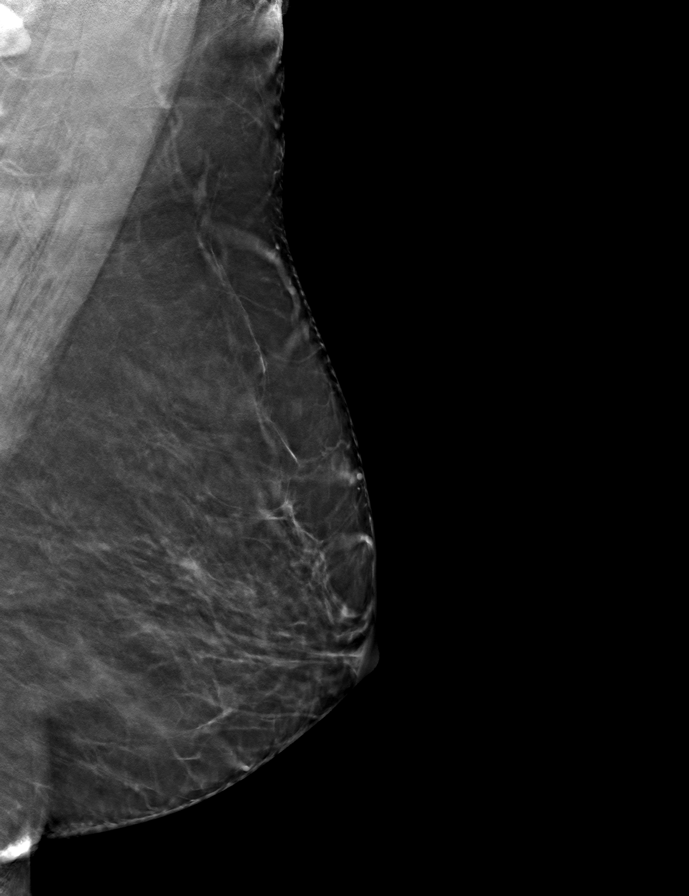

[R MLO tomo · tomo slice 38/75.0]
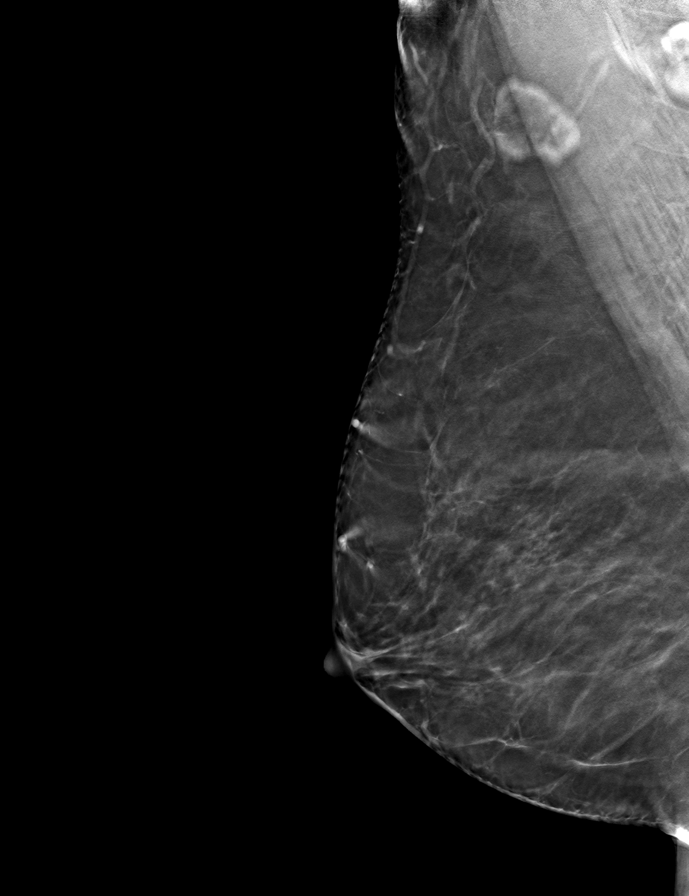

[9 of 24 positions shown; findings below may reference images not displayed]

ACR Breast Density Category b: There are scattered areas of
fibroglandular density.
FINDINGS: There are no findings suspicious for malignancy.
IMPRESSION: No mammographic evidence of malignancy. A result letter of this
screening mammogram will be mailed directly to the patient.

RECOMMENDATION:
Screening mammogram in one year. (Code:51-O-LD2)

BI-RADS CATEGORY  1: Negative.
